# Patient Record
Sex: Female | Born: 1935 | Race: Black or African American | Hispanic: No | Smoking: Never smoker
Health system: Southern US, Community
[De-identification: ages and names within clinical notes are randomized; demographics above are authoritative.]

## PROBLEM LIST (undated history)

## (undated) DIAGNOSIS — R1013 Epigastric pain: Secondary | ICD-10-CM

## (undated) DIAGNOSIS — I10 Essential (primary) hypertension: Secondary | ICD-10-CM

## (undated) DIAGNOSIS — E785 Hyperlipidemia, unspecified: Secondary | ICD-10-CM

## (undated) DIAGNOSIS — R51 Headache: Secondary | ICD-10-CM

## (undated) DIAGNOSIS — K649 Unspecified hemorrhoids: Secondary | ICD-10-CM

## (undated) DIAGNOSIS — E119 Type 2 diabetes mellitus without complications: Secondary | ICD-10-CM

## (undated) DIAGNOSIS — N181 Chronic kidney disease, stage 1: Secondary | ICD-10-CM

## (undated) DIAGNOSIS — M1990 Unspecified osteoarthritis, unspecified site: Secondary | ICD-10-CM

## (undated) DIAGNOSIS — R55 Syncope and collapse: Secondary | ICD-10-CM

## (undated) DIAGNOSIS — M109 Gout, unspecified: Secondary | ICD-10-CM

## (undated) DIAGNOSIS — K3189 Other diseases of stomach and duodenum: Secondary | ICD-10-CM

## (undated) DIAGNOSIS — R519 Headache, unspecified: Secondary | ICD-10-CM

## (undated) DIAGNOSIS — T6701XA Heatstroke and sunstroke, initial encounter: Secondary | ICD-10-CM

## (undated) HISTORY — DX: Other diseases of stomach and duodenum: K31.89

## (undated) HISTORY — PX: COLON SURGERY: SHX602

## (undated) HISTORY — DX: Type 2 diabetes mellitus without complications: E11.9

## (undated) HISTORY — DX: Gout, unspecified: M10.9

## (undated) HISTORY — DX: Hyperlipidemia, unspecified: E78.5

## (undated) HISTORY — DX: Unspecified hemorrhoids: K64.9

## (undated) HISTORY — PX: TONSILLECTOMY: SHX5217

## (undated) HISTORY — DX: Chronic kidney disease, stage 1: N18.1

## (undated) HISTORY — DX: Essential (primary) hypertension: I10

## (undated) HISTORY — DX: Heatstroke and sunstroke, initial encounter: T67.01XA

## (undated) HISTORY — DX: Other diseases of stomach and duodenum: R10.13

---

## 1998-01-28 ENCOUNTER — Encounter: Payer: Self-pay | Admitting: Internal Medicine

## 1998-01-28 LAB — CONVERTED CEMR LAB

## 2000-03-01 ENCOUNTER — Encounter: Payer: Self-pay | Admitting: Internal Medicine

## 2000-03-01 ENCOUNTER — Encounter: Admission: RE | Admit: 2000-03-01 | Discharge: 2000-03-01 | Payer: Self-pay | Admitting: Internal Medicine

## 2001-02-28 ENCOUNTER — Encounter: Payer: Self-pay | Admitting: Internal Medicine

## 2001-02-28 ENCOUNTER — Encounter: Admission: RE | Admit: 2001-02-28 | Discharge: 2001-02-28 | Payer: Self-pay | Admitting: Internal Medicine

## 2002-04-03 ENCOUNTER — Encounter: Admission: RE | Admit: 2002-04-03 | Discharge: 2002-04-03 | Payer: Self-pay | Admitting: Internal Medicine

## 2002-04-03 ENCOUNTER — Encounter: Payer: Self-pay | Admitting: Internal Medicine

## 2002-11-17 ENCOUNTER — Encounter: Admission: RE | Admit: 2002-11-17 | Discharge: 2002-11-17 | Payer: Self-pay | Admitting: Internal Medicine

## 2002-11-17 ENCOUNTER — Encounter: Payer: Self-pay | Admitting: Internal Medicine

## 2003-01-20 ENCOUNTER — Encounter: Payer: Self-pay | Admitting: Emergency Medicine

## 2003-01-20 ENCOUNTER — Emergency Department (HOSPITAL_COMMUNITY): Admission: EM | Admit: 2003-01-20 | Discharge: 2003-01-20 | Payer: Self-pay | Admitting: Emergency Medicine

## 2003-04-13 ENCOUNTER — Encounter: Payer: Self-pay | Admitting: Internal Medicine

## 2003-04-13 ENCOUNTER — Encounter: Admission: RE | Admit: 2003-04-13 | Discharge: 2003-04-13 | Payer: Self-pay | Admitting: Internal Medicine

## 2003-05-10 ENCOUNTER — Encounter: Admission: RE | Admit: 2003-05-10 | Discharge: 2003-05-10 | Payer: Self-pay | Admitting: Occupational Medicine

## 2003-05-10 ENCOUNTER — Encounter: Payer: Self-pay | Admitting: Occupational Medicine

## 2003-10-01 ENCOUNTER — Ambulatory Visit (HOSPITAL_COMMUNITY): Admission: RE | Admit: 2003-10-01 | Discharge: 2003-10-01 | Payer: Self-pay | Admitting: Internal Medicine

## 2004-06-09 ENCOUNTER — Encounter: Admission: RE | Admit: 2004-06-09 | Discharge: 2004-06-09 | Payer: Self-pay | Admitting: Internal Medicine

## 2004-09-15 ENCOUNTER — Ambulatory Visit: Payer: Self-pay | Admitting: Internal Medicine

## 2004-10-24 ENCOUNTER — Ambulatory Visit: Payer: Self-pay | Admitting: Internal Medicine

## 2004-10-31 ENCOUNTER — Ambulatory Visit: Payer: Self-pay | Admitting: Internal Medicine

## 2005-01-12 ENCOUNTER — Ambulatory Visit: Payer: Self-pay | Admitting: Internal Medicine

## 2005-01-19 ENCOUNTER — Ambulatory Visit: Payer: Self-pay | Admitting: Internal Medicine

## 2005-02-01 ENCOUNTER — Ambulatory Visit: Payer: Self-pay | Admitting: Internal Medicine

## 2005-02-14 ENCOUNTER — Ambulatory Visit: Payer: Self-pay | Admitting: Internal Medicine

## 2005-03-22 ENCOUNTER — Ambulatory Visit: Payer: Self-pay | Admitting: Internal Medicine

## 2005-03-22 ENCOUNTER — Ambulatory Visit (HOSPITAL_COMMUNITY): Admission: RE | Admit: 2005-03-22 | Discharge: 2005-03-22 | Payer: Self-pay | Admitting: Internal Medicine

## 2005-03-28 ENCOUNTER — Emergency Department (HOSPITAL_COMMUNITY): Admission: EM | Admit: 2005-03-28 | Discharge: 2005-03-29 | Payer: Self-pay | Admitting: Emergency Medicine

## 2005-03-31 ENCOUNTER — Emergency Department (HOSPITAL_COMMUNITY): Admission: EM | Admit: 2005-03-31 | Discharge: 2005-04-01 | Payer: Self-pay | Admitting: Emergency Medicine

## 2005-04-05 ENCOUNTER — Ambulatory Visit: Payer: Self-pay | Admitting: Internal Medicine

## 2005-04-13 ENCOUNTER — Ambulatory Visit: Payer: Self-pay | Admitting: Internal Medicine

## 2005-04-20 ENCOUNTER — Ambulatory Visit: Payer: Self-pay | Admitting: Internal Medicine

## 2005-04-25 ENCOUNTER — Emergency Department (HOSPITAL_COMMUNITY): Admission: EM | Admit: 2005-04-25 | Discharge: 2005-04-25 | Payer: Self-pay | Admitting: Emergency Medicine

## 2005-05-04 ENCOUNTER — Ambulatory Visit: Payer: Self-pay | Admitting: Internal Medicine

## 2005-05-04 ENCOUNTER — Ambulatory Visit: Payer: Self-pay | Admitting: Gastroenterology

## 2005-05-10 ENCOUNTER — Ambulatory Visit: Payer: Self-pay | Admitting: Internal Medicine

## 2005-05-18 ENCOUNTER — Ambulatory Visit: Payer: Self-pay | Admitting: Gastroenterology

## 2005-07-27 ENCOUNTER — Encounter: Admission: RE | Admit: 2005-07-27 | Discharge: 2005-07-27 | Payer: Self-pay | Admitting: Internal Medicine

## 2005-11-23 ENCOUNTER — Ambulatory Visit: Payer: Self-pay | Admitting: Internal Medicine

## 2006-02-25 ENCOUNTER — Encounter: Admission: RE | Admit: 2006-02-25 | Discharge: 2006-02-25 | Payer: Self-pay | Admitting: Occupational Medicine

## 2006-05-09 IMAGING — CR DG RIBS 2V*R*
4 series · 4 of 4 positions shown · non-contrast
Comparison: none

CLINICAL DATA: 79-year-old female, status post fall with pain in right ribs. 
 RIGHT RIBS - 4 VIEW:

[view not recorded (1 of 4)]
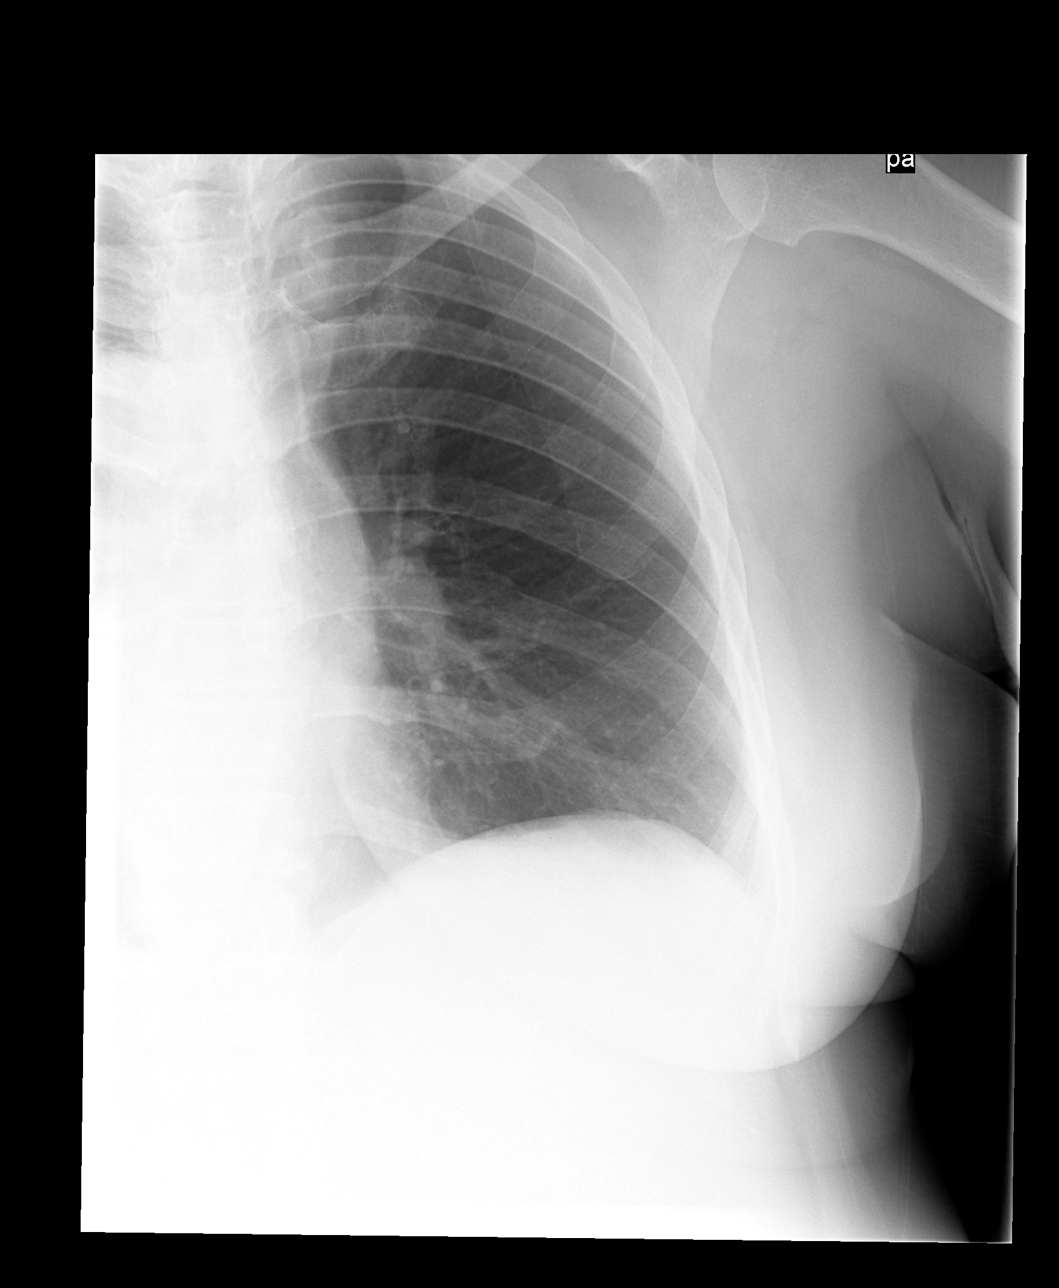

[view not recorded (2 of 4)]
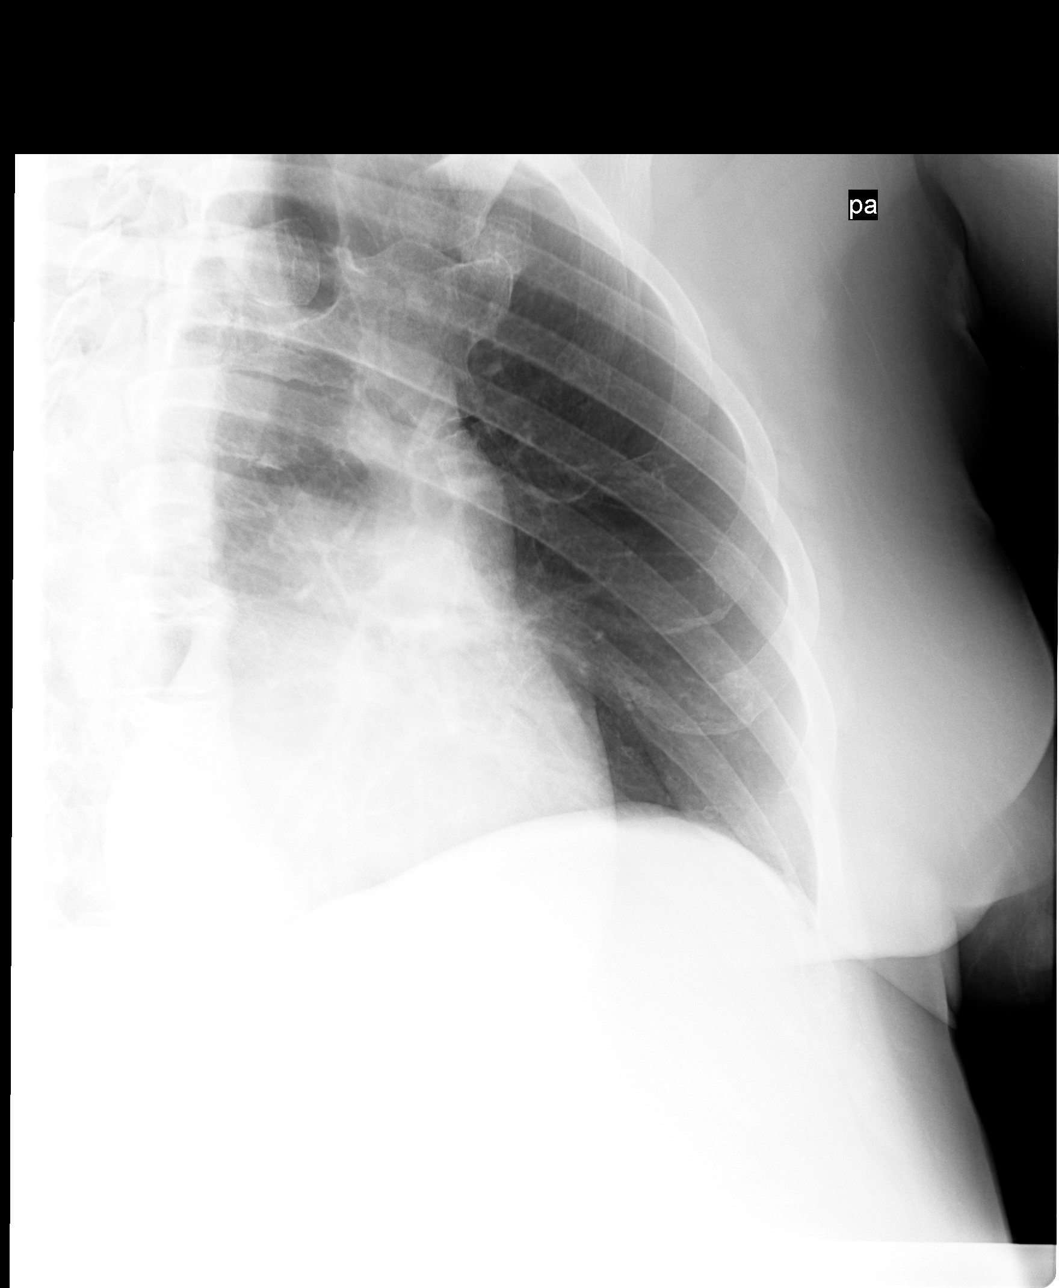

[view not recorded (3 of 4)]
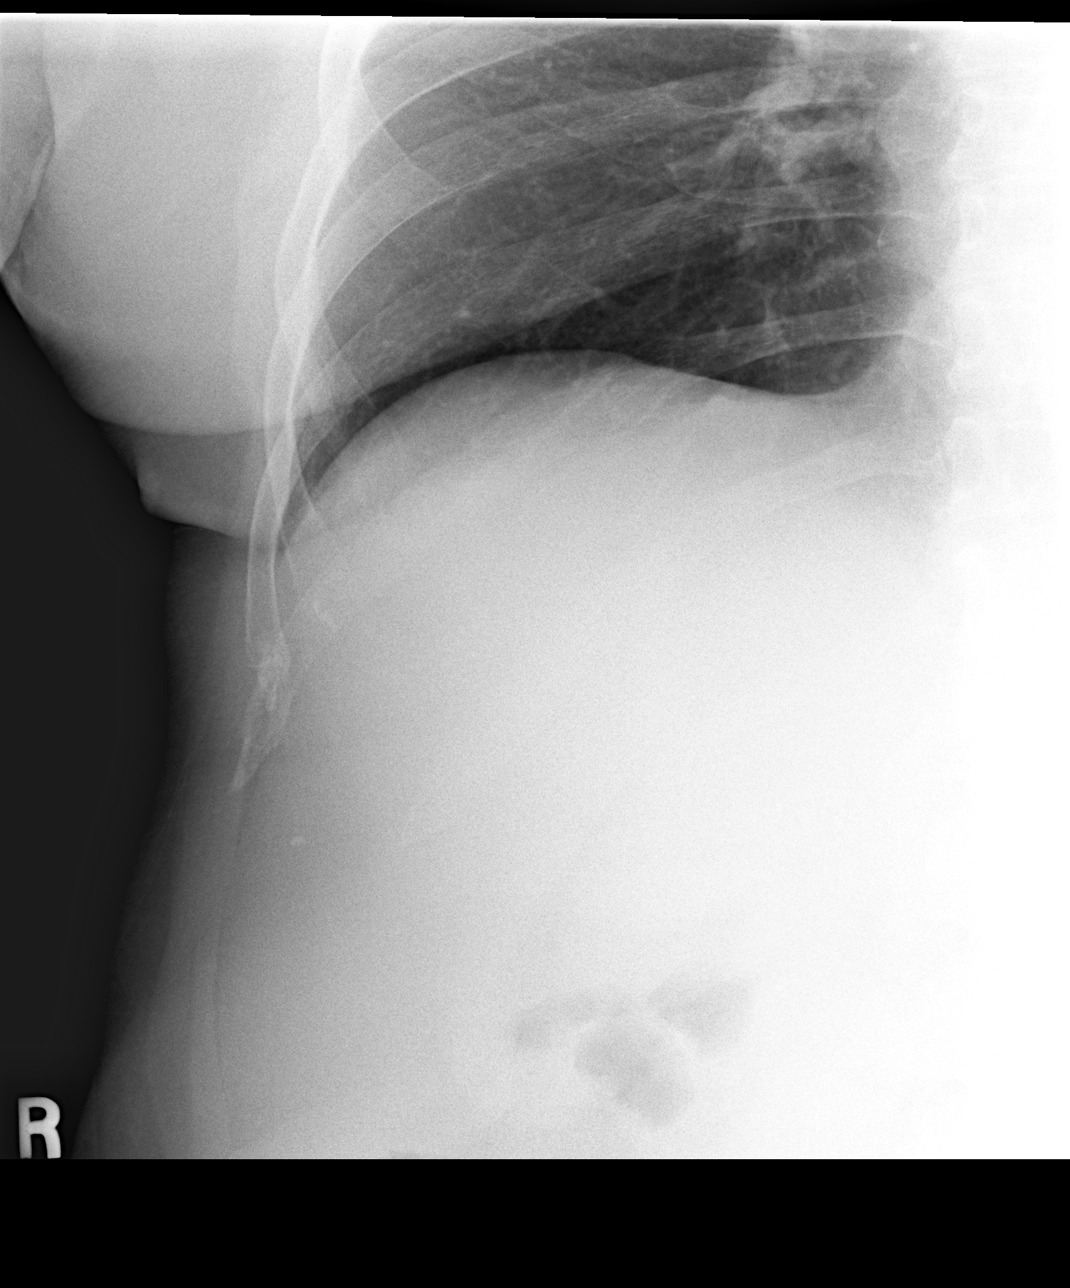

[view not recorded (4 of 4)]
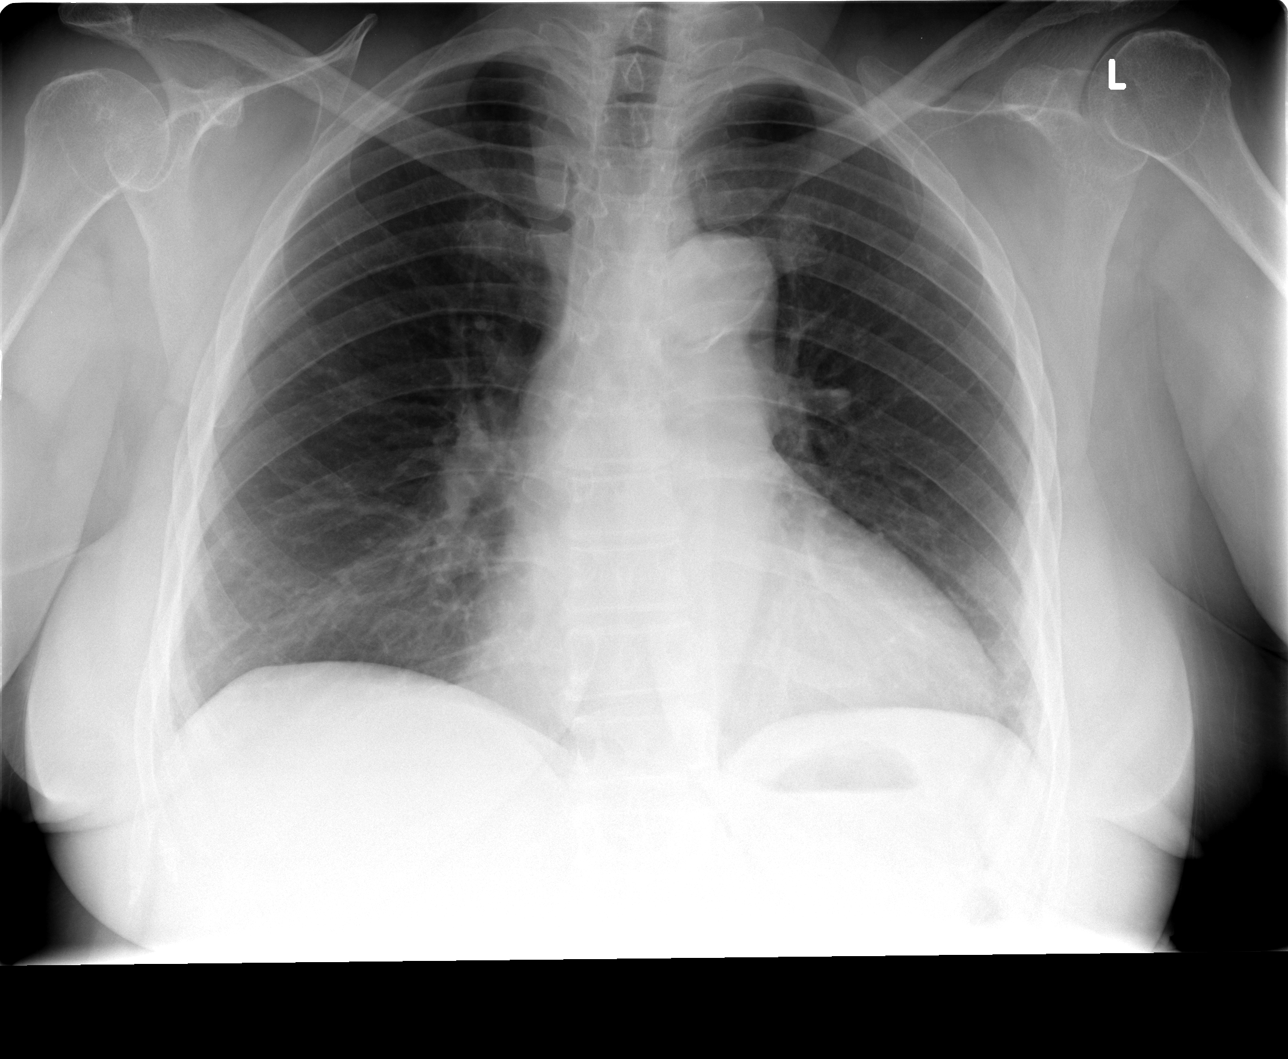

[4 of 4 positions shown; findings below may reference images not displayed]

FINDINGS: A single AP view of the chest demonstrates cardiopericardial silhouette is mildly enlarged.   Atherosclerotic calcifications are noted at the aortic arch.   The lungs are clear.   No focal rib fracture is identified.
IMPRESSION: 1.  Cardiomegaly without failure.
 2.  No focal abnormality of the ribs.

## 2006-06-21 ENCOUNTER — Ambulatory Visit: Payer: Self-pay | Admitting: Internal Medicine

## 2006-07-30 ENCOUNTER — Ambulatory Visit: Payer: Self-pay | Admitting: Internal Medicine

## 2006-09-20 ENCOUNTER — Encounter: Admission: RE | Admit: 2006-09-20 | Discharge: 2006-09-20 | Payer: Self-pay | Admitting: Occupational Medicine

## 2006-10-25 ENCOUNTER — Encounter: Admission: RE | Admit: 2006-10-25 | Discharge: 2006-10-25 | Payer: Self-pay | Admitting: Internal Medicine

## 2006-11-18 ENCOUNTER — Ambulatory Visit: Payer: Self-pay | Admitting: Internal Medicine

## 2006-12-20 ENCOUNTER — Ambulatory Visit: Payer: Self-pay | Admitting: Internal Medicine

## 2007-07-22 ENCOUNTER — Encounter: Payer: Self-pay | Admitting: Internal Medicine

## 2007-07-22 DIAGNOSIS — E7800 Pure hypercholesterolemia, unspecified: Secondary | ICD-10-CM

## 2007-07-22 DIAGNOSIS — M109 Gout, unspecified: Secondary | ICD-10-CM

## 2007-07-22 DIAGNOSIS — I10 Essential (primary) hypertension: Secondary | ICD-10-CM

## 2007-07-22 DIAGNOSIS — E1169 Type 2 diabetes mellitus with other specified complication: Secondary | ICD-10-CM

## 2007-09-23 ENCOUNTER — Encounter: Payer: Self-pay | Admitting: Internal Medicine

## 2007-09-23 ENCOUNTER — Telehealth: Payer: Self-pay | Admitting: Internal Medicine

## 2007-10-29 ENCOUNTER — Encounter: Payer: Self-pay | Admitting: Internal Medicine

## 2007-11-12 ENCOUNTER — Telehealth: Payer: Self-pay | Admitting: Internal Medicine

## 2007-11-21 ENCOUNTER — Encounter: Admission: RE | Admit: 2007-11-21 | Discharge: 2007-11-21 | Payer: Self-pay | Admitting: Internal Medicine

## 2008-01-09 ENCOUNTER — Ambulatory Visit: Payer: Self-pay | Admitting: Internal Medicine

## 2008-01-09 LAB — CONVERTED CEMR LAB
ALT: 29 units/L (ref 0–35)
AST: 28 units/L (ref 0–37)
BUN: 28 mg/dL — ABNORMAL HIGH (ref 6–23)
Bilirubin, Direct: 0.1 mg/dL (ref 0.0–0.3)
CO2: 30 meq/L (ref 19–32)
Calcium: 9.9 mg/dL (ref 8.4–10.5)
Chloride: 105 meq/L (ref 96–112)
Glucose, Bld: 113 mg/dL — ABNORMAL HIGH (ref 70–99)
Hgb A1c MFr Bld: 7.6 % — ABNORMAL HIGH (ref 4.6–6.0)
Sodium: 141 meq/L (ref 135–145)
Total Bilirubin: 0.6 mg/dL (ref 0.3–1.2)
Total Protein: 7.7 g/dL (ref 6.0–8.3)
Triglycerides: 227 mg/dL (ref 0–149)

## 2008-01-11 ENCOUNTER — Encounter: Payer: Self-pay | Admitting: Internal Medicine

## 2008-02-27 ENCOUNTER — Encounter: Payer: Self-pay | Admitting: Internal Medicine

## 2008-03-09 ENCOUNTER — Telehealth: Payer: Self-pay | Admitting: Internal Medicine

## 2008-04-01 ENCOUNTER — Encounter: Payer: Self-pay | Admitting: Internal Medicine

## 2008-06-04 ENCOUNTER — Telehealth: Payer: Self-pay | Admitting: Internal Medicine

## 2008-06-07 ENCOUNTER — Ambulatory Visit: Payer: Self-pay | Admitting: Internal Medicine

## 2008-06-07 DIAGNOSIS — T6701XA Heatstroke and sunstroke, initial encounter: Secondary | ICD-10-CM

## 2008-06-22 ENCOUNTER — Telehealth: Payer: Self-pay | Admitting: Internal Medicine

## 2008-07-13 ENCOUNTER — Telehealth: Payer: Self-pay | Admitting: Internal Medicine

## 2008-07-14 ENCOUNTER — Ambulatory Visit: Payer: Self-pay | Admitting: Internal Medicine

## 2008-09-27 ENCOUNTER — Telehealth: Payer: Self-pay | Admitting: Internal Medicine

## 2008-10-05 ENCOUNTER — Telehealth: Payer: Self-pay | Admitting: Internal Medicine

## 2009-01-17 ENCOUNTER — Telehealth: Payer: Self-pay | Admitting: Internal Medicine

## 2009-01-18 ENCOUNTER — Ambulatory Visit: Payer: Self-pay | Admitting: Internal Medicine

## 2009-01-18 LAB — CONVERTED CEMR LAB
Alkaline Phosphatase: 92 units/L (ref 39–117)
Bilirubin, Direct: 0.1 mg/dL (ref 0.0–0.3)
CO2: 30 meq/L (ref 19–32)
Calcium: 9.4 mg/dL (ref 8.4–10.5)
Chloride: 104 meq/L (ref 96–112)
Cholesterol: 117 mg/dL (ref 0–200)
GFR calc Af Amer: 51 mL/min
HDL: 39.7 mg/dL (ref >39.0)
Total Bilirubin: 0.8 mg/dL (ref 0.3–1.2)
Total CHOL/HDL Ratio: 2.9
Triglycerides: 147 mg/dL (ref 0–149)

## 2009-01-22 ENCOUNTER — Encounter: Payer: Self-pay | Admitting: Internal Medicine

## 2009-05-17 ENCOUNTER — Telehealth: Payer: Self-pay | Admitting: Internal Medicine

## 2009-05-23 ENCOUNTER — Telehealth: Payer: Self-pay | Admitting: Internal Medicine

## 2009-06-15 ENCOUNTER — Encounter: Payer: Self-pay | Admitting: Internal Medicine

## 2009-06-22 ENCOUNTER — Encounter: Payer: Self-pay | Admitting: Internal Medicine

## 2009-07-21 ENCOUNTER — Telehealth: Payer: Self-pay | Admitting: Internal Medicine

## 2009-09-19 ENCOUNTER — Telehealth: Payer: Self-pay | Admitting: Internal Medicine

## 2009-09-30 ENCOUNTER — Telehealth: Payer: Self-pay | Admitting: Internal Medicine

## 2009-10-21 ENCOUNTER — Ambulatory Visit: Payer: Self-pay | Admitting: Internal Medicine

## 2009-10-21 DIAGNOSIS — N181 Chronic kidney disease, stage 1: Secondary | ICD-10-CM

## 2009-10-21 LAB — CONVERTED CEMR LAB
BUN: 41 mg/dL — ABNORMAL HIGH (ref 6–23)
Bilirubin, Direct: 0.1 mg/dL (ref 0.0–0.3)
Calcium: 9.3 mg/dL (ref 8.4–10.5)
Direct LDL: 41.7 mg/dL
GFR calc non Af Amer: 39.77 mL/min (ref >60)
Glucose, Bld: 122 mg/dL — ABNORMAL HIGH (ref 70–99)
HDL: 36.4 mg/dL — ABNORMAL LOW (ref >39.00)
Total Bilirubin: 0.5 mg/dL (ref 0.3–1.2)
Total Protein: 7.8 g/dL (ref 6.0–8.3)
Triglycerides: 233 mg/dL — ABNORMAL HIGH (ref 0.0–149.0)
Uric Acid, Serum: 8.6 mg/dL — ABNORMAL HIGH (ref 2.4–7.0)
VLDL: 46.6 mg/dL — ABNORMAL HIGH (ref 0.0–40.0)

## 2009-12-05 ENCOUNTER — Telehealth: Payer: Self-pay | Admitting: Internal Medicine

## 2010-01-09 ENCOUNTER — Telehealth: Payer: Self-pay | Admitting: Internal Medicine

## 2010-01-17 ENCOUNTER — Encounter: Payer: Self-pay | Admitting: Internal Medicine

## 2010-02-13 ENCOUNTER — Telehealth: Payer: Self-pay | Admitting: Internal Medicine

## 2010-02-23 ENCOUNTER — Telehealth: Payer: Self-pay | Admitting: Internal Medicine

## 2010-03-02 ENCOUNTER — Ambulatory Visit: Payer: Self-pay | Admitting: Internal Medicine

## 2010-03-02 DIAGNOSIS — R1013 Epigastric pain: Secondary | ICD-10-CM

## 2010-03-02 DIAGNOSIS — K3189 Other diseases of stomach and duodenum: Secondary | ICD-10-CM

## 2010-03-20 ENCOUNTER — Encounter: Payer: Self-pay | Admitting: Internal Medicine

## 2010-03-22 ENCOUNTER — Telehealth: Payer: Self-pay | Admitting: Internal Medicine

## 2010-04-24 ENCOUNTER — Encounter: Payer: Self-pay | Admitting: Internal Medicine

## 2010-05-08 ENCOUNTER — Encounter: Payer: Self-pay | Admitting: Internal Medicine

## 2010-05-17 ENCOUNTER — Telehealth: Payer: Self-pay | Admitting: Internal Medicine

## 2010-05-18 ENCOUNTER — Telehealth: Payer: Self-pay | Admitting: Internal Medicine

## 2010-05-25 ENCOUNTER — Telehealth: Payer: Self-pay | Admitting: Internal Medicine

## 2010-05-29 ENCOUNTER — Ambulatory Visit: Payer: Self-pay | Admitting: Internal Medicine

## 2010-05-29 DIAGNOSIS — K648 Other hemorrhoids: Secondary | ICD-10-CM | POA: Insufficient documentation

## 2010-05-31 ENCOUNTER — Telehealth (INDEPENDENT_AMBULATORY_CARE_PROVIDER_SITE_OTHER): Payer: Self-pay | Admitting: *Deleted

## 2010-06-19 ENCOUNTER — Telehealth: Payer: Self-pay | Admitting: Internal Medicine

## 2010-07-04 ENCOUNTER — Telehealth: Payer: Self-pay | Admitting: Internal Medicine

## 2010-07-05 ENCOUNTER — Ambulatory Visit: Payer: Self-pay | Admitting: Internal Medicine

## 2010-07-05 DIAGNOSIS — K3184 Gastroparesis: Secondary | ICD-10-CM | POA: Insufficient documentation

## 2010-07-12 ENCOUNTER — Telehealth: Payer: Self-pay | Admitting: Internal Medicine

## 2010-07-18 ENCOUNTER — Telehealth: Payer: Self-pay | Admitting: Internal Medicine

## 2010-08-08 ENCOUNTER — Ambulatory Visit (HOSPITAL_COMMUNITY): Admission: RE | Admit: 2010-08-08 | Discharge: 2010-08-08 | Payer: Self-pay | Admitting: Internal Medicine

## 2010-08-22 ENCOUNTER — Ambulatory Visit: Payer: Self-pay | Admitting: Internal Medicine

## 2010-10-27 ENCOUNTER — Telehealth: Payer: Self-pay | Admitting: Internal Medicine

## 2010-11-20 ENCOUNTER — Telehealth: Payer: Self-pay | Admitting: Internal Medicine

## 2010-12-03 ENCOUNTER — Encounter: Payer: Self-pay | Admitting: Internal Medicine

## 2010-12-12 NOTE — Letter (Signed)
Summary: [facility]   Imported By: Lennie Odor 06/01/2010 10:11:53  _____________________________________________________________________  External Attachment:    Type:   Image     Comment:   External Document

## 2010-12-12 NOTE — Progress Notes (Signed)
       Past History:  Care Management: Ophthalmology:Pt had dilitated  eye exam with Dr Pearlean Brownie 04/24/2010 no cataract or diabetic retinopathy found. Advised f/u in 1 year

## 2010-12-12 NOTE — Progress Notes (Signed)
  Phone Note Refill Request Message from:  Fax from Pharmacy on July 04, 2010 10:36 AM  Refills Requested: Medication #1:  DICLOFENAC SODIUM 50 MG  TBEC two times a day Please Advise refills.   Initial call taken by: Ami Bullins CMA,  July 04, 2010 10:37 AM  Follow-up for Phone Call        ok for refill prn Follow-up by: Jacques Navy MD,  July 04, 2010 1:15 PM    Prescriptions: DICLOFENAC SODIUM 50 MG  TBEC (DICLOFENAC SODIUM) two times a day  #180 Tablet x 0   Entered by:   Ami Bullins CMA   Authorized by:   Jacques Navy MD   Signed by:   Bill Salinas CMA on 07/05/2010   Method used:   [facility]  [address] [phone][facility] (retail)       [address]       [place], [place]  [address]       Ph: [phone] or [phone]       Fax: [phone]   RxID:   [id number]

## 2010-12-12 NOTE — Medication Information (Signed)
Summary: [facility]   Imported By: Sherian Rein 05/10/2010 14:40:19  _____________________________________________________________________  External Attachment:    Type:   Image     Comment:   External Document

## 2010-12-12 NOTE — Progress Notes (Signed)
Summary: Gas  Phone Note Call from Patient Call back at Home Phone [phone]   Summary of Call: Mylanta advised at last office visit for gas has not helped. She c/o "everything she eats" causing gas. Please advise.  Initial call taken by: Lamar Sprinkles, CMA,  June 19, 2010 9:37 AM  Follow-up for Phone Call        continue Beano. Take "Gas-X" as well. Ifsym;ptoms persist will increase priolosec (omeprazole) to 40mg  daily, If symptoms persist after that GI consult Follow-up by: Jacques Navy MD,  June 19, 2010 9:40 AM  Additional Follow-up for Phone Call Additional follow up Details #1::        Patient notified.Marland KitchenMarland KitchenMarland KitchenAlvy Beal Archie CMA  June 19, 2010 4:51 PM     New/Updated Medications: OMEPRAZOLE 20 MG CPDR (OMEPRAZOLE) 2 q am Prescriptions: OMEPRAZOLE 20 MG CPDR (OMEPRAZOLE) 2 q am  #60 x 6   Entered by:   Rock Nephew CMA   Authorized by:   Jacques Navy MD   Signed by:   Rock Nephew CMA on 06/19/2010   Method used:   [facility]        [facility]  [address] [phone]* (retail)       [address]       [place], [place]  [id number]       Ph: [phone] or [phone]       Fax: [phone]   RxID:   [id number]

## 2010-12-12 NOTE — Progress Notes (Signed)
Summary: RF  Phone Note Refill Request   Refills Requested: Medication #1:  HEMORRHOIDAL-HC 25 MG  SUPP INSERT SUPPOSITORY two times a day as needed Pt's last ov was 07/05/2010, please Advise refill  Initial call taken by: Ami Bullins CMA,  July 18, 2010 4:20 PM  Follow-up for Phone Call        ok to refill. If hemorrhoids not resolved after second round of suppositories will need to be seen.  Follow-up by: Jacques Navy MD,  July 18, 2010 5:38 PM  Additional Follow-up for Phone Call Additional follow up Details #1::        informed pt  Additional Follow-up by: Ami Bullins CMA,  July 19, 2010 9:13 AM    Prescriptions: HEMORRHOIDAL-HC 25 MG  SUPP (HYDROCORTISONE ACETATE) INSERT SUPPOSITORY two times a day as needed  #12 Supposito x 0   Entered by:   Lamar Sprinkles, CMA   Authorized by:   Jacques Navy MD   Signed by:   Lamar Sprinkles, CMA on 07/18/2010   Method used:   [facility]        [facility]  [address] [phone]* (retail)       [address]       [place], [place]  [address]       Ph: [phone] or [phone]       Fax: [phone]   RxID:   [id number]

## 2010-12-12 NOTE — Assessment & Plan Note (Signed)
Summary: ABD PROBLEMS/ NAUSEA/NWS   Vital Signs:  Patient profile:   77 year old female Height:      64 inches Weight:      183 pounds BMI:     31.53 O2 Sat:      98 % on Room air Temp:     98.1 degrees F oral Pulse rate:   72 / minute BP sitting:   138 / 70  (left arm) Cuff size:   regular  Vitals Entered By: Bill Salinas CMA (July 05, 2010 11:03 AM)  O2 Flow:  Room air CC: pt here with c/o nausea/ ab   Primary Care Provider:  Norins  CC:  pt here with c/o nausea/ ab.  History of Present Illness: Patient presents for persistent post-prandial nausea, without emesis. No pain in upper abdomen. she has some increased eructation. No swallow difficulty. She reports regular bowel movements. She does have minor bloating post-prandial as well as flatus, better with Beano. She denies early satiety. She denies hematochezia or melena. She does take MOM in addition to Beano.   Current Medications (verified): 1)  Norvasc 10 Mg  Tabs (Amlodipine Besylate) .... Once Daily 2)  Cozaar 100 Mg  Tabs (Losartan Potassium) .... Once Daily 3)  Bumex 2 Mg  Tabs (Bumetanide) .... Once Daily 4)  Amaryl 4 Mg  Tabs (Glimepiride) .... Am and Pm For Control of Diabetes 5)  Clonidine Hcl 0.1 Mg Tabs (Clonidine Hcl) .Marland Kitchen.. 1 By Mouth Two Times A Day For Blood Pressure 6)  Daily Multiple Vitamins   Tabs (Multiple Vitamin) .... Once Daily 7)  Bl Vitamin C 500 Mg  Tabs (Ascorbic Acid) .... Once Daily 8)  Diclofenac Sodium 50 Mg  Tbec (Diclofenac Sodium) .... Two Times A Day 9)  Simvastatin 40 Mg  Tabs (Simvastatin) .... 1/2 Once Daily 10)  Meclizine Hcl 25 Mg  Tabs (Meclizine Hcl) .... As Needed 11)  Alprazolam 0.25 Mg  Tabs (Alprazolam) .... 1/4  By Mouth Once Daily As Needed 12)  Hemorrhoidal-Hc 25 Mg  Supp (Hydrocortisone Acetate) .... Insert Suppository Two Times A Day As Needed 13)  Promethazine Hcl 12.5 Mg Tabs (Promethazine Hcl) .Marland Kitchen.. 1 Q 6 As Needed Nausea 14)  Promethazine-Codeine 6.25-10 Mg/46ml Syrp  (Promethazine-Codeine) .Marland Kitchen.. 1 Tsp Q 6 As Needed Cough 15)  Allopurinol 100 Mg Tabs (Allopurinol) .Marland Kitchen.. 1 By Mouth Once Daily For Prevention of Gout. 16)  Omeprazole 20 Mg Cpdr (Omeprazole) .... 2 Q Am  Allergies (verified): 1)  ! Cipro 2)  ! Cardizem  Past History:  Past Medical History: Last updated: 05/29/2010 DYSPEPSIA (ICD-536.8) CHRONIC KIDNEY DISEASE STAGE I (ICD-585.1) HEAT STROKE (ICD-992.0) * COLON SURGERY HYPERLIPIDEMIA (ICD-272.4) GOUT (ICD-274.9) DIABETES MELLITUS, TYPE II (ICD-250.00) HYPERTENSION (ICD-401.9) Hemorrhoids  Past Surgical History: Last updated: 07/22/2007 Tonsillectomy * COLON SURGERY    PSH reviewed for relevance, FH reviewed for relevance  Review of Systems       The patient complains of abdominal pain.  The patient denies anorexia, fever, weight loss, weight gain, chest pain, dyspnea on exertion, prolonged cough, hemoptysis, melena, hematochezia, incontinence, suspicious skin lesions, difficulty walking, depression, unusual weight change, and enlarged lymph nodes.    Physical Exam  General:  overweight AA female in no distress Head:  normocephalic and atraumatic.   Eyes:  C&S clear Neck:  supple.   Chest Wall:  no deformities and no tenderness.   Lungs:  normal respiratory effort and normal breath sounds.   Heart:  normal rate, regular rhythm, no murmur,  no gallop, and no JVD.   Abdomen:  soft, non-tender, no distention, no guarding, no rigidity, and no hepatomegaly.   Msk:  normal ROM, no joint tenderness, no joint swelling, and no joint warmth.   Pulses:  2+ radial Neurologic:  alert & oriented X3, cranial nerves II-XII intact, strength normal in all extremities, sensation intact to light touch, and gait normal.   Skin:  turgor normal, color normal, no suspicious lesions, no petechiae, and no ulcerations.   Psych:  Oriented X3, memory intact for recent and remote, normally interactive, and good eye contact.     Impression &  Recommendations:  Problem # 1:  NAUSEA, CHRONIC (ICD-787.02)  Suspect gastroparesis as cause of nausea. May also be some irritation from diclofenac although she has no tenderness to palp.   Plan - hold diclofenac          gastric emptying study.  Her updated medication list for this problem includes:    Meclizine Hcl 25 Mg Tabs (Meclizine hcl) .Marland Kitchen... As needed  Orders: Radiology Referral (Radiology)  Complete Medication List: 1)  Norvasc 10 Mg Tabs (Amlodipine besylate) .... Once daily 2)  Cozaar 100 Mg Tabs (Losartan potassium) .... Once daily 3)  Bumex 2 Mg Tabs (Bumetanide) .... Once daily 4)  Amaryl 4 Mg Tabs (Glimepiride) .... Am and pm for control of diabetes 5)  Clonidine Hcl 0.1 Mg Tabs (Clonidine hcl) .Marland Kitchen.. 1 by mouth two times a day for blood pressure 6)  Daily Multiple Vitamins Tabs (Multiple vitamin) .... Once daily 7)  Bl Vitamin C 500 Mg Tabs (Ascorbic acid) .... Once daily 8)  Diclofenac Sodium 50 Mg Tbec (Diclofenac sodium) .... Two times a day 9)  Simvastatin 40 Mg Tabs (Simvastatin) .... 1/2 once daily 10)  Meclizine Hcl 25 Mg Tabs (Meclizine hcl) .... As needed 11)  Alprazolam 0.25 Mg Tabs (Alprazolam) .... 1/4  by mouth once daily as needed 12)  Hemorrhoidal-hc 25 Mg Supp (Hydrocortisone acetate) .... Insert suppository two times a day as needed 13)  Promethazine Hcl 12.5 Mg Tabs (Promethazine hcl) .Marland Kitchen.. 1 q 6 as needed nausea 14)  Promethazine-codeine 6.25-10 Mg/39ml Syrp (Promethazine-codeine) .Marland Kitchen.. 1 tsp q 6 as needed cough 15)  Allopurinol 100 Mg Tabs (Allopurinol) .Marland Kitchen.. 1 by mouth once daily for prevention of gout. 16)  Omeprazole 20 Mg Cpdr (Omeprazole) .... 2 q am  Patient Instructions: 1)  Nausea - suspect there may be a problem with how the stomach is working (gastroparesis) that may be the cause of nausea. It may also be a problem from the diclofenac. Plan - hold the diclofenac for 1 week. Will have staff schedule a gastric emptying study at radiology  department to measure stomach activity.

## 2010-12-12 NOTE — Assessment & Plan Note (Signed)
Summary: DISCUSS GASTRIC EMPTYING STUDY PER FLAG/NWS  #   Vital Signs:  Patient profile:   77 year old female Height:      64 inches (162.56 cm) Weight:      182.75 pounds (83.07 kg) BMI:     31.48 O2 Sat:      98 % on Room air Temp:     97.7 degrees F (36.50 degrees C) oral Pulse rate:   67 / minute BP sitting:   136 / 72  (left arm) Cuff size:   regular  Vitals Entered By: Brenton Grills MA (August 22, 2010 9:49 AM)  O2 Flow:  Room air CC: discuss gastric emptying study/aj Is Patient Diabetic? Yes   Primary Care Elleen Coulibaly:  Norins  CC:  discuss gastric emptying study/aj.  History of Present Illness: Patient had recent gastric emptying study - 91% retention at 2 hrs, normal 30% or less. She denies pain, severe bloating or pain with eating.   Current Medications (verified): 1)  Norvasc 10 Mg  Tabs (Amlodipine Besylate) .... Once Daily 2)  Cozaar 100 Mg  Tabs (Losartan Potassium) .... Once Daily 3)  Bumex 2 Mg  Tabs (Bumetanide) .... Once Daily 4)  Amaryl 4 Mg  Tabs (Glimepiride) .... Am and Pm For Control of Diabetes 5)  Clonidine Hcl 0.1 Mg Tabs (Clonidine Hcl) .Marland Kitchen.. 1 By Mouth Two Times A Day For Blood Pressure 6)  Daily Multiple Vitamins   Tabs (Multiple Vitamin) .... Once Daily 7)  Bl Vitamin C 500 Mg  Tabs (Ascorbic Acid) .... Once Daily 8)  Diclofenac Sodium 50 Mg  Tbec (Diclofenac Sodium) .... Two Times A Day 9)  Simvastatin 40 Mg  Tabs (Simvastatin) .... 1/2 Once Daily 10)  Meclizine Hcl 25 Mg  Tabs (Meclizine Hcl) .... As Needed 11)  Alprazolam 0.25 Mg  Tabs (Alprazolam) .... 1/4  By Mouth Once Daily As Needed 12)  Hemorrhoidal-Hc 25 Mg  Supp (Hydrocortisone Acetate) .... Insert Suppository Two Times A Day As Needed 13)  Promethazine Hcl 12.5 Mg Tabs (Promethazine Hcl) .Marland Kitchen.. 1 Q 6 As Needed Nausea 14)  Promethazine-Codeine 6.25-10 Mg/40ml Syrp (Promethazine-Codeine) .Marland Kitchen.. 1 Tsp Q 6 As Needed Cough 15)  Allopurinol 100 Mg Tabs (Allopurinol) .Marland Kitchen.. 1 By Mouth Once Daily  For Prevention of Gout. 16)  Omeprazole 20 Mg Cpdr (Omeprazole) .... 2 Q Am  Allergies (verified): 1)  ! Cipro 2)  ! Cardizem PMH-FH-SH reviewed-no changes except otherwise noted  Review of Systems  The patient denies anorexia, weight loss, abdominal pain, severe indigestion/heartburn, genital sores, difficulty walking, abnormal bleeding, and angioedema.    Physical Exam  General:  elderly AA woman in no distress Head:  Normocephalic and atraumatic without obvious abnormalities. No apparent alopecia or balding. Lungs:  normal respiratory effort and normal breath sounds.   Heart:  normal rate, regular rhythm, no rub, and no JVD.   Abdomen:  soft, non-tender, no guarding, no rigidity, and no abdominal hernia, hypoactive bowel sounds.   Neurologic:  alert & oriented X3 and gait normal.   Skin:  turgor normal and color normal.   Psych:  normally interactive, good eye contact, not anxious appearing, and not depressed appearing.     Impression & Recommendations:  Problem # 1:  GASTROPARESIS (ICD-536.3) probable cause of nausea is gastroparesis related to DM. she is not uncomfortable and is able to eat without N/V  Plan - no additional medications at this time.   Complete Medication List: 1)  Norvasc 10 Mg Tabs (Amlodipine  besylate) .... Once daily 2)  Cozaar 100 Mg Tabs (Losartan potassium) .... Once daily 3)  Bumex 2 Mg Tabs (Bumetanide) .... Once daily 4)  Amaryl 4 Mg Tabs (Glimepiride) .... Am and pm for control of diabetes 5)  Clonidine Hcl 0.1 Mg Tabs (Clonidine hcl) .Marland Kitchen.. 1 by mouth two times a day for blood pressure 6)  Daily Multiple Vitamins Tabs (Multiple vitamin) .... Once daily 7)  Bl Vitamin C 500 Mg Tabs (Ascorbic acid) .... Once daily 8)  Simvastatin 40 Mg Tabs (Simvastatin) .... 1/2 once daily 9)  Meclizine Hcl 25 Mg Tabs (Meclizine hcl) .... As needed 10)  Alprazolam 0.25 Mg Tabs (Alprazolam) .... 1/4  by mouth once daily as needed 11)  Hemorrhoidal-hc 25 Mg Supp  (Hydrocortisone acetate) .... Insert suppository two times a day as needed 12)  Promethazine Hcl 12.5 Mg Tabs (Promethazine hcl) .Marland Kitchen.. 1 q 6 as needed nausea 13)  Promethazine-codeine 6.25-10 Mg/46ml Syrp (Promethazine-codeine) .Marland Kitchen.. 1 tsp q 6 as needed cough 14)  Allopurinol 100 Mg Tabs (Allopurinol) .Marland Kitchen.. 1 by mouth once daily for prevention of gout. 15)  Omeprazole 20 Mg Cpdr (Omeprazole) .... 2 q am

## 2010-12-12 NOTE — Letter (Signed)
Summary: [facility]   Imported By: Lester Alvord 01/30/2010 09:41:20  _____________________________________________________________________  External Attachment:    Type:   Image     Comment:   External Document

## 2010-12-12 NOTE — Progress Notes (Signed)
  Phone Note Refill Request Message from:  Fax from Pharmacy on December 05, 2009 12:00 PM  Refills Requested: Medication #1:  DICLOFENAC SODIUM 50 MG  TBEC two times a day   Last Refilled: 09/06/2009 please Advise refill. [facility] [address]  Initial call taken by: Ami Bullins CMA,  December 05, 2009 12:00 PM  Follow-up for Phone Call        refill as needed  Follow-up by: Jacques Navy MD,  December 05, 2009 1:07 PM    Prescriptions: DICLOFENAC SODIUM 50 MG  TBEC (DICLOFENAC SODIUM) two times a day  #180 x 0   Entered by:   Ami Bullins CMA   Authorized by:   Jacques Navy MD   Signed by:   Bill Salinas CMA on 12/05/2009   Method used:   [facility]        [facility]  [address] [phone]* (retail)       [address]       [place], [place]  [id number]       Ph: [phone] or [phone]       Fax: [phone]   RxID:   [id number]

## 2010-12-12 NOTE — Letter (Signed)
Summary: Assunta Curtis MD  Assunta Curtis MD   Imported By: Lester [place] 03/29/2010 10:55:00  _____________________________________________________________________  External Attachment:    Type:   Image     Comment:   External Document

## 2010-12-12 NOTE — Progress Notes (Signed)
  Phone Note Refill Request Message from:  Fax from Pharmacy on July 12, 2010 1:50 PM  Refills Requested: Medication #1:  BUMEX 2 MG  TABS once daily Please Advise refills  Initial call taken by: Ami Bullins CMA,  July 12, 2010 1:50 PM  Follow-up for Phone Call        ok to refill as needed  Follow-up by: Jacques Navy MD,  July 13, 2010 8:00 AM    Prescriptions: BUMEX 2 MG  TABS (BUMETANIDE) once daily  #30 Tablet x 1   Entered by:   Ami Bullins CMA   Authorized by:   Jacques Navy MD   Signed by:   Bill Salinas CMA on 07/13/2010   Method used:   Telephoned to ...       [facility]  [address] [phone]* (retail)       [address]       [place], [place]  [address]       Ph: [phone] or [phone]       Fax: [phone]   RxID:   [id number]

## 2010-12-12 NOTE — Progress Notes (Signed)
  Phone Note Refill Request Message from:  Fax from Pharmacy on February 13, 2010 11:09 AM  Refills Requested: Medication #1:  BUMEX 2 MG  TABS once daily   Last Refilled: 09/01/2009 Initial call taken by: Ami Bullins CMA,  February 13, 2010 11:09 AM    Prescriptions: BUMEX 2 MG  TABS (BUMETANIDE) once daily  #30 Tablet x 4   Entered by:   Ami Bullins CMA   Authorized by:   Jacques Navy MD   Signed by:   Bill Salinas CMA on 02/13/2010   Method used:   [facility]ronically to        [facility] [phone]* (retail)       [address]       [place], [place]  [address]       Ph: [phone] or [phone]       Fax: [phone]   RxID:   [id number]

## 2010-12-12 NOTE — Progress Notes (Signed)
Summary: Abd pain  Phone Note Call from Patient Call back at Home Phone [phone]   Summary of Call: Patient left message on triage that she is having gastric issues and increased gas causing pain under her ribs. Patient would like call from triage. Initial call taken by: Lucious Groves,  February 23, 2010 4:03 PM  Follow-up for Phone Call        Pt c/o painful "rumbling" off and on x 2 wks. Describes area as center of the chest at "bottom of stomach". Very frequently this happens when she is hungry. Beano and eating will help some. No change in bowel movement, has frequent constipation, uses MOM 2 x's a week. Please advise. Follow-up by: Lamar Sprinkles, CMA,  February 23, 2010 6:46 PM  Additional Follow-up for Phone Call Additional follow up Details #1::        Will need an OV, but while waiting she should start taking otc priolosec 20mg  q AM Additional Follow-up by: Jacques Navy MD,  February 24, 2010 4:16 AM    Additional Follow-up for Phone Call Additional follow up Details #2::    Pt informed  Follow-up by: Lamar Sprinkles, CMA,  February 24, 2010 9:11 AM  New/Updated Medications: OMEPRAZOLE 20 MG CPDR (OMEPRAZOLE) 1 q am Prescriptions: OMEPRAZOLE 20 MG CPDR (OMEPRAZOLE) 1 q am  #30 x 0   Entered by:   Lamar Sprinkles, CMA   Authorized by:   Jacques Navy MD   Signed by:   Lamar Sprinkles, CMA on 02/24/2010   Method used:   [facility]ronically to        [facility]  [address] [phone]* (retail)       [address]       [place], [place]  [address]       Ph: [phone] or [phone]       Fax: [phone]   RxID:   [id number]

## 2010-12-12 NOTE — Progress Notes (Signed)
Summary: Hemorrhoids  ---- Converted from flag ---- ---- 05/24/2010 4:20 PM, Verdell Face wrote: [name] ([id number]) called and wanted to let Dr Debby Bud know she has had a flare-up of hemorroids, pt using supposites twice a day, still hard for her to pass her "gas" waht should she do?  Pt wants a call [phone].   Thanks, Elnita Maxwell ------------------------------  Phone Note From Other Clinic   Summary of Call: Do you want to see pt tomorrow?  Initial call taken by: Lamar Sprinkles, CMA,  May 25, 2010 5:25 PM  Follow-up for Phone Call        ok for appt tomorrow. Follow-up by: Jacques Navy MD,  May 25, 2010 5:41 PM  Additional Follow-up for Phone Call Additional follow up Details #1::        Spoke w/pt, she declined office visit today due to other apts and "things she needs to do". Advised office visit asap if symptoms become worse, pt agreed and will call office back so schedule.  Additional Follow-up by: Lamar Sprinkles, CMA,  May 26, 2010 9:28 AM

## 2010-12-12 NOTE — Progress Notes (Signed)
Summary: ALT MED - [name] pt  Phone Note Call from Patient Call back at [facility] Phone [phone]   Summary of Call: Patient is requesting alt med to the hydrocortisone suppositiories for hemorrhoids. She says current RX has not give much relief.  Has also used prep - H OTC.  Initial call taken by: Lamar Sprinkles, CMA,  May 18, 2010 11:17 AM  Follow-up for Phone Call        Cont HCT supp Stool softner ie Metamucil 2 caps two times a day OV w/Dr Tamon Parkerson next wk Follow-up by: Tresa Garter MD,  May 18, 2010 1:03 PM  Additional Follow-up for Phone Call Additional follow up Details #1::        Pt informed  Additional Follow-up by: Lamar Sprinkles, CMA,  May 18, 2010 4:21 PM

## 2010-12-12 NOTE — Progress Notes (Signed)
  Phone Note Refill Request Message from:  Fax from Pharmacy on Mar 22, 2010 1:11 PM  Refills Requested: Medication #1:  PROMETHAZINE HCL 12.5 MG TABS 1 q 6 as needed nausea Initial call taken by: Rock Nephew CMA,  Mar 22, 2010 1:12 PM    Prescriptions: PROMETHAZINE HCL 12.5 MG TABS (PROMETHAZINE HCL) 1 q 6 as needed nausea  #20 x 0   Entered by:   Rock Nephew CMA   Authorized by:   Jacques Navy MD   Signed by:   Rock Nephew CMA on 03/22/2010   Method used:   [facility] to        [facility]  [address] [phone]* (retail)       [address]       [place], [place]  [address]       Ph: [phone] or [phone]       Fax: [phone]   RxID:   [id number]

## 2010-12-12 NOTE — Assessment & Plan Note (Signed)
Summary: gas--stc   Vital Signs:  Patient profile:   77 year old female Height:      64 inches Weight:      187 pounds BMI:     32.21 O2 Sat:      99 % on Room air Temp:     97.5 degrees F oral Pulse rate:   68 / minute BP sitting:   132 / 68  (left arm) Cuff size:   large  Vitals Entered By: Bill Salinas CMA (May 29, 2010 9:13 AM)  O2 Flow:  Room air CC: pt here with c/o bloating and gas as well as hemorrhoids swelling/ ab   Primary Care Provider:  Norins  CC:  pt here with c/o bloating and gas as well as hemorrhoids swelling/ ab.  History of Present Illness: Has had a flare of hemorrhoid and she had trouble with constipation and flatus. She is feeling bloated and full. She is using hemorhoidal HC 25mg  suppository - no pain or bleeding. Her gas is up high.  Current Medications (verified): 1)  Norvasc 10 Mg  Tabs (Amlodipine Besylate) .... Once Daily 2)  Cozaar 100 Mg  Tabs (Losartan Potassium) .... Once Daily 3)  Bumex 2 Mg  Tabs (Bumetanide) .... Once Daily 4)  Amaryl 4 Mg  Tabs (Glimepiride) .... Am and Pm For Control of Diabetes 5)  Clonidine Hcl 0.1 Mg Tabs (Clonidine Hcl) .Marland Kitchen.. 1 By Mouth Two Times A Day For Blood Pressure 6)  Daily Multiple Vitamins   Tabs (Multiple Vitamin) .... Once Daily 7)  Bl Vitamin C 500 Mg  Tabs (Ascorbic Acid) .... Once Daily 8)  Diclofenac Sodium 50 Mg  Tbec (Diclofenac Sodium) .... Two Times A Day 9)  Simvastatin 40 Mg  Tabs (Simvastatin) .... 1/2 Once Daily 10)  Meclizine Hcl 25 Mg  Tabs (Meclizine Hcl) .... As Needed 11)  Alprazolam 0.25 Mg  Tabs (Alprazolam) .... 1/4  By Mouth Once Daily As Needed 12)  Hemorrhoidal-Hc 25 Mg  Supp (Hydrocortisone Acetate) .... Insert Suppository Two Times A Day As Needed 13)  Promethazine Hcl 12.5 Mg Tabs (Promethazine Hcl) .Marland Kitchen.. 1 Q 6 As Needed Nausea 14)  Promethazine-Codeine 6.25-10 Mg/93ml Syrp (Promethazine-Codeine) .Marland Kitchen.. 1 Tsp Q 6 As Needed Cough 15)  Allopurinol 100 Mg Tabs (Allopurinol) .Marland Kitchen.. 1 By  Mouth Once Daily For Prevention of Gout. 16)  Omeprazole 20 Mg Cpdr (Omeprazole) .Marland Kitchen.. 1 Q Am  Allergies (verified): 1)  ! Cipro 2)  ! Cardizem  Past History:  Past Surgical History: Last updated: 07/22/2007 Tonsillectomy * COLON SURGERY     Past Medical History: DYSPEPSIA (ICD-536.8) CHRONIC KIDNEY DISEASE STAGE I (ICD-585.1) HEAT STROKE (ICD-992.0) * COLON SURGERY HYPERLIPIDEMIA (ICD-272.4) GOUT (ICD-274.9) DIABETES MELLITUS, TYPE II (ICD-250.00) HYPERTENSION (ICD-401.9) Hemorrhoids PSH reviewed for relevance, FH reviewed for relevance  Review of Systems  The patient denies anorexia, fever, weight loss, weight gain, vision loss, chest pain, syncope, dyspnea on exertion, peripheral edema, abdominal pain, hematochezia, and difficulty walking.    Physical Exam  General:  Elderly AA female in no distress Head:  Normocephalic and atraumatic without obvious abnormalities. No apparent alopecia or balding. Eyes:  C&S clear Abdomen:  soft, non-tender, normal bowel sounds, no distention, and no masses.   Psych:  Oriented X3, memory intact for recent and remote, normally interactive, and good eye contact.     Impression & Recommendations:  Problem # 1:  INTERNAL HEMORRHOIDS WITHOUT MENTION COMP (ICD-455.0) Patient with mild bloating and gas due to hemorroids.  Plan -  Mylanta II q 6 as needed for gas and bloating.   Complete Medication List: 1)  Norvasc 10 Mg Tabs (Amlodipine besylate) .... Once daily 2)  Cozaar 100 Mg Tabs (Losartan potassium) .... Once daily 3)  Bumex 2 Mg Tabs (Bumetanide) .... Once daily 4)  Amaryl 4 Mg Tabs (Glimepiride) .... Am and pm for control of diabetes 5)  Clonidine Hcl 0.1 Mg Tabs (Clonidine hcl) .Marland Kitchen.. 1 by mouth two times a day for blood pressure 6)  Daily Multiple Vitamins Tabs (Multiple vitamin) .... Once daily 7)  Bl Vitamin C 500 Mg Tabs (Ascorbic acid) .... Once daily 8)  Diclofenac Sodium 50 Mg Tbec (Diclofenac sodium) .... Two times a  day 9)  Simvastatin 40 Mg Tabs (Simvastatin) .... 1/2 once daily 10)  Meclizine Hcl 25 Mg Tabs (Meclizine hcl) .... As needed 11)  Alprazolam 0.25 Mg Tabs (Alprazolam) .... 1/4  by mouth once daily as needed 12)  Hemorrhoidal-hc 25 Mg Supp (Hydrocortisone acetate) .... Insert suppository two times a day as needed 13)  Promethazine Hcl 12.5 Mg Tabs (Promethazine hcl) .Marland Kitchen.. 1 q 6 as needed nausea 14)  Promethazine-codeine 6.25-10 Mg/71ml Syrp (Promethazine-codeine) .Marland Kitchen.. 1 tsp q 6 as needed cough 15)  Allopurinol 100 Mg Tabs (Allopurinol) .Marland Kitchen.. 1 by mouth once daily for prevention of gout. 16)  Omeprazole 20 Mg Cpdr (Omeprazole) .Marland Kitchen.. 1 q am  Patient Instructions: 1)  Bloating and gas - due to hemorrhoids. Plan - take Mylanta II 1 capful every 4 hours as needed for bloating and gas or constipation. 2)  Blod pressure - 132/68 today = good control.

## 2010-12-12 NOTE — Progress Notes (Signed)
Summary: Refill--Alprazolam  Phone Note Refill Request Message from:  Fax from Pharmacy on May 17, 2010 10:14 AM  Refills Requested: Medication #1:  ALPRAZOLAM 0.25 MG  TABS 1/4  by mouth once daily as needed Initial call taken by: Lucious Groves,  May 17, 2010 10:14 AM  Follow-up for Phone Call        ok to fill as before Follow-up by: Newt Lukes MD,  May 17, 2010 11:09 AM    Prescriptions: ALPRAZOLAM 0.25 MG  TABS (ALPRAZOLAM) 1/4  by mouth once daily as needed  #30 x 5   Entered by:   Lucious Groves   Authorized by:   Newt Lukes MD   Signed by:   Lucious Groves on 05/17/2010   Method used:   [facility]ph[facility] ...       [facility]  [address] [phone][facility] (retail)       [address]       [place], [place]  [address]       Ph: [phone] or [phone]       Fax: [phone]   RxID:   [id number]

## 2010-12-12 NOTE — Assessment & Plan Note (Signed)
Summary: f/u per sarah/cd   Vital Signs:  Patient profile:   77 year old female Height:      64 inches (162.56 cm) Weight:      188 pounds (85.45 kg) BMI:     32.39 O2 Sat:      98 % on Room air Temp:     98.1 degrees F (36.72 degrees C) oral Pulse rate:   78 / minute BP sitting:   142 / 82  (left arm) Cuff size:   large  Vitals Entered By: Brenton Grills (March 02, 2010 9:21 AM)  O2 Flow:  Room air  Primary Care Provider:  Ettie Krontz   History of Present Illness: Patient had called 4/14 c/o abdominal pain made better with eating. she was prescribed priolosec and this has helped the abdominal pain. She thinks the pain may be related to eating fruit. Beano also helps. She has eructation, no hemetemesis. No change in bowel habit, no hematochezia or melena. She does have hemorrhoids.  Patient has been taking her blood pressure medication and has been doing well.  Patient follows with [name] for DM. Her last A1C there was 8.3% and per his office note a repeat was order for March 8th - result not available to me. She reports that her CBGs have been satisfactory.   Current Medications (verified): 1)  Norvasc 10 Mg  Tabs (Amlodipine Besylate) .... Once Daily 2)  Cozaar 100 Mg  Tabs (Losartan Potassium) .... Once Daily 3)  Bumex 2 Mg  Tabs (Bumetanide) .... Once Daily 4)  Amaryl 4 Mg  Tabs (Glimepiride) .... Am and Pm For Control of Diabetes 5)  Clonidine Hcl 0.1 Mg Tabs (Clonidine Hcl) .Marland Kitchen.. 1 By Mouth Two Times A Day For Blood Pressure 6)  Daily Multiple Vitamins   Tabs (Multiple Vitamin) .... Once Daily 7)  Bl Vitamin C 500 Mg  Tabs (Ascorbic Acid) .... Once Daily 8)  Diclofenac Sodium 50 Mg  Tbec (Diclofenac Sodium) .... Two Times A Day 9)  Simvastatin 40 Mg  Tabs (Simvastatin) .... 1/2 Once Daily 10)  Meclizine Hcl 25 Mg  Tabs (Meclizine Hcl) .... As Needed 11)  Alprazolam 0.25 Mg  Tabs (Alprazolam) .... 1/4  By Mouth Once Daily As Needed 12)  Hemorrhoidal-Hc 25 Mg  Supp  (Hydrocortisone Acetate) .... Insert Suppository Two Times A Day As Needed 13)  Promethazine Hcl 12.5 Mg Tabs (Promethazine Hcl) .Marland Kitchen.. 1 Q 6 As Needed Nausea 14)  Promethazine-Codeine 6.25-10 Mg/59ml Syrp (Promethazine-Codeine) .Marland Kitchen.. 1 Tsp Q 6 As Needed Cough 15)  Allopurinol 100 Mg Tabs (Allopurinol) .Marland Kitchen.. 1 By Mouth Once Daily For Prevention of Gout. 16)  Omeprazole 20 Mg Cpdr (Omeprazole) .Marland Kitchen.. 1 Q Am  Allergies (verified): 1)  ! Cipro 2)  ! Cardizem  Past History:  Past Surgical History: Last updated: 07/22/2007 Tonsillectomy * COLON SURGERY     Family History: Last updated: 01/09/2008 Sister - DM Sister - breast cancer - recovered Neg- colon cancer, CAD/MI  Social History: Last updated: 01/09/2008 HSG married '59-widowed '99 1 son - '59; 2 daughter - '69, '79; grandchildren 4; great-grandchildren 3 Lives alone work: [employer] service since '71  Past Medical History: DYSPEPSIA (ICD-536.8) CHRONIC KIDNEY DISEASE STAGE I (ICD-585.1) HEAT STROKE (ICD-992.0) * COLON SURGERY HYPERLIPIDEMIA (ICD-272.4) GOUT (ICD-274.9) DIABETES MELLITUS, TYPE II (ICD-250.00) HYPERTENSION (ICD-401.9)  Review of Systems       The patient complains of abdominal pain and severe indigestion/heartburn.  The patient denies anorexia, fever, weight loss, weight gain,  hoarseness, chest pain, dyspnea on exertion, peripheral edema, headaches, hemoptysis, melena, hematochezia, muscle weakness, difficulty walking, depression, enlarged lymph nodes, and angioedema.    Physical Exam  General:  WNWD, overweight AA female in no distress Head:  normocephalic and atraumatic.   Eyes:  pupils equal, pupils round, corneas and lenses clear, and no injection.   Neck:  supple and full ROM.   Lungs:  normal respiratory effort and normal breath sounds.   Heart:  normal rate, regular rhythm, and no murmur.   Abdomen:  soft, non-tender, normal bowel sounds, no guarding, no rigidity, and no hepatomegaly.   Msk:   normal ROM, no joint tenderness, no joint swelling, no joint warmth, and no joint deformities.   Neurologic:  alert & oriented X3, cranial nerves II-XII intact, strength normal in all extremities, and gait normal.     Impression & Recommendations:  Problem # 1:  DYSPEPSIA (ICD-536.8) pain most likely dyspepsia without ulcer  Plan - continue PPI x 1 month then prn  Problem # 2:  DIABETES MELLITUS, TYPE II (ICD-250.00) Per [name]  Her updated medication list for this problem includes:    Cozaar 100 Mg Tabs (Losartan potassium) ..... Once daily    Amaryl 4 Mg Tabs (Glimepiride) .Marland Kitchen... Am and pm for control of diabetes  Problem # 3:  HYPERTENSION (ICD-401.9)  Her updated medication list for this problem includes:    Norvasc 10 Mg Tabs (Amlodipine besylate) ..... Once daily    Cozaar 100 Mg Tabs (Losartan potassium) ..... Once daily    Bumex 2 Mg Tabs (Bumetanide) ..... Once daily    Clonidine Hcl 0.1 Mg Tabs (Clonidine hcl) .Marland Kitchen... 1 by mouth two times a day for blood pressure  BP today: 142/82 Prior BP: 168/82 (10/21/2009)  Adequate control on present meds. She has routine lab work at Dr. Rinaldo Cloud office re: electrolytes.   Complete Medication List: 1)  Norvasc 10 Mg Tabs (Amlodipine besylate) .... Once daily 2)  Cozaar 100 Mg Tabs (Losartan potassium) .... Once daily 3)  Bumex 2 Mg Tabs (Bumetanide) .... Once daily 4)  Amaryl 4 Mg Tabs (Glimepiride) .... Am and pm for control of diabetes 5)  Clonidine Hcl 0.1 Mg Tabs (Clonidine hcl) .Marland Kitchen.. 1 by mouth two times a day for blood pressure 6)  Daily Multiple Vitamins Tabs (Multiple vitamin) .... Once daily 7)  Bl Vitamin C 500 Mg Tabs (Ascorbic acid) .... Once daily 8)  Diclofenac Sodium 50 Mg Tbec (Diclofenac sodium) .... Two times a day 9)  Simvastatin 40 Mg Tabs (Simvastatin) .... 1/2 once daily 10)  Meclizine Hcl 25 Mg Tabs (Meclizine hcl) .... As needed 11)  Alprazolam 0.25 Mg Tabs (Alprazolam) .... 1/4  by mouth once daily as  needed 12)  Hemorrhoidal-hc 25 Mg Supp (Hydrocortisone acetate) .... Insert suppository two times a day as needed 13)  Promethazine Hcl 12.5 Mg Tabs (Promethazine hcl) .Marland Kitchen.. 1 q 6 as needed nausea 14)  Promethazine-codeine 6.25-10 Mg/10ml Syrp (Promethazine-codeine) .Marland Kitchen.. 1 tsp q 6 as needed cough 15)  Allopurinol 100 Mg Tabs (Allopurinol) .Marland Kitchen.. 1 by mouth once daily for prevention of gout. 16)  Omeprazole 20 Mg Cpdr (Omeprazole) .Marland Kitchen.. 1 q am  Patient Instructions: 1)  Abdominal pain - most likely increased acid which is better with omeprazole. Plan - take omeprazole every day for one month then take only as needed. 2)  Diabetes- follow-up with [name] 3)  High Blood pressure - good control on present medications.

## 2010-12-12 NOTE — Progress Notes (Signed)
  Phone Note Refill Request Message from:  Fax from Pharmacy on January 09, 2010 10:25 AM  Refills Requested: Medication #1:  COZAAR 100 MG  TABS once daily Initial call taken by: Ami Bullins CMA,  January 09, 2010 10:25 AM    Prescriptions: COZAAR 100 MG  TABS (LOSARTAN POTASSIUM) once daily  #90 x 3   Entered by:   Ami Bullins CMA   Authorized by:   Jacques Navy MD   Signed by:   Bill Salinas CMA on 01/09/2010   Method used:   [facility]        [facility] [phone][facility] (retail)       [address]       [place], [place]  [address]       Ph: [phone] or [phone]       Fax: [phone]   RxID:   [id number]

## 2010-12-14 NOTE — Progress Notes (Signed)
Summary: Diclofenac  Phone Note Call from Patient Call back at Home Phone [phone]   Summary of Call: Patient is requesting refill of diclofenac. Med was removed from list on 08/2010. Pt was advised to hold med due to possible cause of GI upset. Pt forgot about this and states she takes it every other day.   Per pt, gas med and omeprazole has helped w/stomach discomfort. OK to resume diclofenac?  Initial call taken by: Lamar Sprinkles, CMA,  October 27, 2010 9:58 AM  Follow-up for Phone Call        prefer to try meloxicam 7.5 mg once a day - less risk of gastric irritation. # 30 refill as needed., Follow-up by: Jacques Navy MD,  October 27, 2010 1:00 PM  Additional Follow-up for Phone Call Additional follow up Details #1::        Pt informed  Additional Follow-up by: Lamar Sprinkles, CMA,  October 27, 2010 2:41 PM    New/Updated Medications: MELOXICAM 7.5 MG TABS (MELOXICAM) 1 once daily as needed pain Prescriptions: MELOXICAM 7.5 MG TABS (MELOXICAM) 1 once daily as needed pain  #30 x 12   Entered by:   Lamar Sprinkles, CMA   Authorized by:   Jacques Navy MD   Signed by:   Lamar Sprinkles, CMA on 10/27/2010   Method used:   Electronically to        [facility]  [address] [phone]* (retail)       [address]       [place], [place]  [address]       Ph: [phone] or [phone]       Fax: [phone]   RxID:   [id number]

## 2010-12-14 NOTE — Progress Notes (Signed)
  Phone Note Refill Request Message from:  Fax from Pharmacy on November 20, 2010 2:23 PM  Refills Requested: Medication #1:  PROMETHAZINE-CODEINE 6.25-10 MG/5ML SYRP 1 tsp q 6 as needed cough   Last Refilled: 10/24/2009 [facility] [address] please advise refills  Initial call taken by: Ami Bullins CMA,  November 20, 2010 2:24 PM  Follow-up for Phone Call        ok for refill x2 Follow-up by: Jacques Navy MD,  November 21, 2010 9:33 AM    Prescriptions: PROMETHAZINE-CODEINE 6.25-10 MG/5ML SYRP (PROMETHAZINE-CODEINE) 1 tsp q 6 as needed cough  #120cc x 2   Entered by:   Bill Salinas CMA   Authorized by:   Jacques Navy MD   Signed by:   Bill Salinas CMA on 11/21/2010   Method used:   Telephoned to ...       [facility]  [address] [phone]* (retail)       [address]       [place], [place]  [address]       Ph: [phone] or [phone]       Fax: [phone]   RxID:   [id number]

## 2010-12-18 ENCOUNTER — Telehealth: Payer: Self-pay | Admitting: Internal Medicine

## 2010-12-28 NOTE — Progress Notes (Signed)
Summary: RX for Gout  Phone Note Call from Patient   Summary of Call: Patient is requesting a call back regarding an rx.  Initial call taken by: Lamar Sprinkles, CMA,  December 18, 2010 9:58 AM  Follow-up for Phone Call        # busy.................Marland KitchenLamar Sprinkles, CMA  December 18, 2010 2:32 PM   Patient is requesting refill of indomethacin for gout flare?  Follow-up by: Lamar Sprinkles, CMA,  December 19, 2010 9:55 AM  Additional Follow-up for Phone Call Additional follow up Details #1::        ok for indomethacin. did rx. called pt  Additional Follow-up by: Jacques Navy MD,  December 19, 2010 5:42 PM    New/Updated Medications: INDOMETHACIN 25 MG CAPS (INDOMETHACIN) 1 by mouth three times a day for flare of gout Prescriptions: INDOMETHACIN 25 MG CAPS (INDOMETHACIN) 1 by mouth three times a day for flare of gout  #30 x 2   Entered and Authorized by:   Jacques Navy MD   Signed by:   Jacques Navy MD on 12/19/2010   Method used:   Electronically to        [facility]  [address] [phone]* (retail)       [address]       [place], [place]  [address]       Ph: [phone] or [phone]       Fax: [phone]   RxID:   [id number]

## 2011-01-11 ENCOUNTER — Encounter: Payer: Self-pay | Admitting: Internal Medicine

## 2011-01-15 ENCOUNTER — Telehealth: Payer: Self-pay | Admitting: Internal Medicine

## 2011-01-19 ENCOUNTER — Telehealth: Payer: Self-pay | Admitting: Internal Medicine

## 2011-01-23 NOTE — Progress Notes (Signed)
  Phone Note Refill Request Message from:  Fax from Pharmacy on January 15, 2011 3:34 PM  Refills Requested: Medication #1:  ALPRAZOLAM 0.25 MG  TABS 1/4  by mouth once daily as needed [facility] [address]  Initial call taken by: Ami Bullins CMA,  January 15, 2011 3:34 PM  Follow-up for Phone Call        ok to refill x 5 Follow-up by: Jacques Navy MD,  January 15, 2011 6:17 PM    Prescriptions: ALPRAZOLAM 0.25 MG  TABS (ALPRAZOLAM) 1/4  by mouth once daily as needed  #30 x 5   Entered by:   Ami Bullins CMA   Authorized by:   Jacques Navy MD   Signed by:   Bill Salinas CMA on 01/16/2011   Method used:   Telephoned to ...       [facility]  [address] [phone]* (retail)       [address]       [place], [place]  [address]       Ph: [phone] or [phone]       Fax: [phone]   RxID:   [id number]

## 2011-01-23 NOTE — Progress Notes (Signed)
  Phone Note Refill Request Message from:  Fax from Pharmacy on January 19, 2011 11:17 AM  Refills Requested: Medication #1:  PROMETHAZINE HCL 12.5 MG TABS 1 q 6 as needed nausea please advise refills  Initial call taken by: Ami Bullins CMA,  January 19, 2011 11:17 AM  Follow-up for Phone Call        ok for refill  2 Follow-up by: Jacques Navy MD,  January 19, 2011 1:04 PM    Prescriptions: PROMETHAZINE HCL 12.5 MG TABS (PROMETHAZINE HCL) 1 q 6 as needed nausea  #20 x 2   Entered by:   Ami Bullins CMA   Authorized by:   Jacques Navy MD   Signed by:   Bill Salinas CMA on 01/19/2011   Method used:   Electronically to        [facility]  [address] [phone]* (retail)       [address]       [place], [place]  [id number]       Ph: [phone] or [phone]       Fax: [phone]   RxID:   [id number]

## 2011-02-06 ENCOUNTER — Emergency Department (HOSPITAL_COMMUNITY)
Admission: EM | Admit: 2011-02-06 | Discharge: 2011-02-06 | Disposition: A | Discharge status: Home / Self Care | Payer: Medicare Other | Attending: Emergency Medicine | Admitting: Emergency Medicine

## 2011-02-06 ENCOUNTER — Emergency Department (HOSPITAL_COMMUNITY): Payer: Medicare Other

## 2011-02-06 DIAGNOSIS — E119 Type 2 diabetes mellitus without complications: Secondary | ICD-10-CM | POA: Insufficient documentation

## 2011-02-06 DIAGNOSIS — R5381 Other malaise: Secondary | ICD-10-CM | POA: Insufficient documentation

## 2011-02-06 DIAGNOSIS — I10 Essential (primary) hypertension: Secondary | ICD-10-CM | POA: Insufficient documentation

## 2011-02-06 DIAGNOSIS — N390 Urinary tract infection, site not specified: Secondary | ICD-10-CM | POA: Insufficient documentation

## 2011-02-06 DIAGNOSIS — R799 Abnormal finding of blood chemistry, unspecified: Secondary | ICD-10-CM | POA: Insufficient documentation

## 2011-02-06 LAB — COMPREHENSIVE METABOLIC PANEL
ALT: 22 U/L (ref 0–35)
Albumin: 4.3 g/dL (ref 3.5–5.2)
Alkaline Phosphatase: 146 U/L — ABNORMAL HIGH (ref 39–117)
Chloride: 94 mEq/L — ABNORMAL LOW (ref 96–112)
GFR calc Af Amer: 40 mL/min — ABNORMAL LOW (ref >60)
Glucose, Bld: 224 mg/dL — ABNORMAL HIGH (ref 70–99)
Potassium: 4.1 mEq/L (ref 3.5–5.1)
Sodium: 129 mEq/L — ABNORMAL LOW (ref 135–145)
Total Bilirubin: 0.4 mg/dL (ref 0.3–1.2)
Total Protein: 9 g/dL — ABNORMAL HIGH (ref 6.0–8.3)

## 2011-02-06 LAB — DIFFERENTIAL
Eosinophils Absolute: 0.2 10*3/uL (ref 0.0–0.7)
Eosinophils Relative: 3 % (ref 0–5)
Lymphs Abs: 2.1 10*3/uL (ref 0.7–4.0)
Monocytes Relative: 7 % (ref 3–12)

## 2011-02-06 LAB — URINALYSIS, ROUTINE W REFLEX MICROSCOPIC
Glucose, UA: NEGATIVE mg/dL
Specific Gravity, Urine: 1.012 (ref 1.005–1.030)
Urobilinogen, UA: 0.2 mg/dL (ref 0.0–1.0)
pH: 5.5 (ref 5.0–8.0)

## 2011-02-06 LAB — CBC
HCT: 35.5 % — ABNORMAL LOW (ref 36.0–46.0)
Hemoglobin: 12.2 g/dL (ref 12.0–15.0)
RDW: 12.7 % (ref 11.5–15.5)
WBC: 8.6 10*3/uL (ref 4.0–10.5)

## 2011-02-06 LAB — URINE MICROSCOPIC-ADD ON

## 2011-02-06 LAB — GLUCOSE, CAPILLARY: Glucose-Capillary: 268 mg/dL — ABNORMAL HIGH (ref 70–99)

## 2011-02-08 LAB — URINE CULTURE

## 2011-02-08 NOTE — Letter (Signed)
Summary: [facility]   Imported By: Lennie Odor 01/29/2011 15:59:47  _____________________________________________________________________  External Attachment:    Type:   Image     Comment:   External Document

## 2011-02-12 ENCOUNTER — Other Ambulatory Visit: Payer: Self-pay | Admitting: Internal Medicine

## 2011-02-19 ENCOUNTER — Telehealth: Payer: Self-pay | Admitting: *Deleted

## 2011-02-19 NOTE — Telephone Encounter (Signed)
[facility] [address] [facility] hydrocortisone AC 25mg  supp. QTY of 12  SIG insert one suppository rectally two times a day prn. Last fill was 08/04/2010, Please Advise refill

## 2011-02-19 NOTE — Telephone Encounter (Signed)
Ok for refill  x3 

## 2011-02-20 MED ORDER — HYDROCORTISONE ACETATE 25 MG RE SUPP: 25.0000 mg | Freq: Two times a day (BID) | RECTAL | Status: DC

## 2011-03-11 ENCOUNTER — Other Ambulatory Visit: Payer: Self-pay | Admitting: Internal Medicine

## 2011-03-29 ENCOUNTER — Encounter: Payer: Self-pay | Admitting: Internal Medicine

## 2011-03-30 NOTE — Assessment & Plan Note (Signed)
[facility]                             PRIMARY CARE OFFICE NOTE   VENDELA, TROUNG                      MRN:          [id number]  DATE:06/21/2006                            DOB:          1925/11/17    [name] is a 77 year old widowed African-American woman who presents  today for followup evaluation and exam.  She was last seen in the office  November 23, 2005 for UTI and upper respiratory infection.  In the interval,  the patient has been seen by Dr. Adrian Prince recently and was found to be  doing well.  He follows her diabetes and follows hemoglobin A1Cs by her  report.  She has been well controlled.  [name] also follows for lipid.  He was kind enough to send labs from December 14, 2005 which revealed a  glucose of 102, normal chemistries, normal CBC, cholesterol was 95,  triglycerides 154, HDL was 40, LDL was 24.  TSH was 0.57.  Liver functions  were normal.  Patient had no significant microalbuminuria.   PAST MEDICAL HISTORY:   SURGICAL:  Tonsillectomy, remote.  Colon surgeries in the 70s for  unspecified reasons.   GYNECOLOGICAL:  Patient is a gravida 3, para 3.   MEDICAL ILLNESS:  1. Usual childhood disease.  2. Non-insulin-dependent diabetes.  3. Hypertension.  4. Gout.  5. Hyperlipidemia.   CURRENT MEDICATIONS:  1. Norvasc 10 mg daily.  2. Cozaar 100 mg daily.  3. Bumex 1 mg daily.  4. Amaryl 4 mg daily.  5. Catapres TTS patch 0.2 mg applied weekly.  6. Diclofenac 50 mg b.i.d.  7. Simvastatin 20 mg daily.  8. Advil p.r.n.  9. Meclizine p.r.n.   FAMILY HISTORY:  Noncontributory.   SOCIAL HISTORY:  The patient is a widow.  Her children are close to her as  are her four sisters, as are her grandchildren and she has several great-  grandchildren.  The patient now has 36-years of employment at [employer] and continues to work.  She is very well-adjusted and seems to be  doing well.   CHART REVIEW:  Past  colonoscopy May 18, 2005 and this was a normal study.  She would be a candidate for followup in 10 years.  Past stress Cardiolite  study May 22, 2004 which was a negative study with no abnormalities, with a  well-preserved and normal ejection fraction.  Her last renal artery and  abdominal ultrasound showed normal velocities in the absence of renal artery  stenosis.  Last mammogram from July 27, 2005 - this was a negative  study.  Last 12-lead electrocardiogram from May 17, 2004 which showed a  sinus rhythm with a question of an old anterior infarction.   REVIEW OF SYSTEMS:  Negative for any constitutional cardiovascular,  respiratory, GI or GU complaints.   EXAMINATION:  VITAL SIGNS:  Temperature was 98.7, blood pressure was 157/86,  pulse 95, weight 192.  GENERAL:  This is an overweight, heavy-set African-American women in no  acute distress.  HEENT:  Normocephalic, atraumatic.  EACs and TMs were unremarkable.  Oropharynx with dentition in good repair.  No buccal or palate lesions were  noted.  Posterior pharynx was clear.  Conjunctivae and sclerae were clear.  PERRLA, EOMI.  Funduscopic exam was unremarkable.  NECK:  Supple without thyromegaly.  NODES:  No adenopathy was noted in the cervical or supraclavicular regions.  CHEST:  No CVA tenderness.  LUNGS:  Clear to auscultation and percussion.  BREASTS:  Skin was normal, nipples without discharge, breasts were  pendulous.  There were no fixed masses, lesions or abnormalities noted.  CARDIOVASCULAR:  2+ radial pulses, no JVD or carotid bruits.  She had a  quiet precordium with a regular rate and rhythm without murmurs, rubs or  gallops.  ABDOMEN:  Obese, soft, no guarding or rebound.  No organosplenomegaly was  noted.  PELVIC:  Deferred.  RECTAL:  Deferred.  EXTREMITIES:  Without clubbing, cyanosis, edema or deformity.  NEUROLOGICAL:  Including sensation was deferred to [name].  SKIN:  Clear.   LABORATORY DATA:  As  noted.   ASSESSMENT/PLAN:  1. Hypertension.  Patient's blood pressure is borderline controlled.  At      this time we will continue her present medications without a change      given that on chart review her blood pressure is variable in the      office. Patient should check her blood pressures at her convenience at      outside facilities such as drug stores or supermarkets.  2. Diabetes under the care of Dr. Ardyth Harps and evidentially well-      controlled.  3. Lipids.  The patient had an excellent response to Simvastatin.  See      laboratory above.  4. Gout.  Stable.  5. __________.  The patient is currently up-to-date with an exam coming up      later this month.  6. Health maintenance:  The patient has had mammography.  She is aged-out      for pelvic and Pap.   SUMMARY:  A very pleasant woman who seems to be medically stable at this  time.  I have asked her to return to see me on a p.r.n. basis.                                   Rosalyn Gess Norins, MD   MEN/MedQ  DD:  06/22/2006  DT:  06/23/2006  Job #:  [id number]   cc:   Charline Bills A. Evlyn Kanner, MD

## 2011-03-30 NOTE — Assessment & Plan Note (Signed)
[facility]                           PRIMARY CARE OFFICE NOTE   NAME:[name], [name]                      MRN:          [id number]  DATE:11/18/2006                            DOB:          August 13, 1926    HISTORY OF PRESENT ILLNESS:  [name] is a 77 year old African  American woman followed for hypertension, insulin-dependent diabetes,  hyperlipidemia, who presents today for concern about palpitations.   The patient reports that last week she had an episode where she had  three irregular heart beats in a row.  She had no chest pain.  No chest  discomfort.  No syncope.  No near syncope.  No shortness of breath or  any other cardiac symptoms.  She has not had a recurrence but she is  concerned.  She reports she may have had some palpitations many years  ago.  Her husband died, but has had no palpitations since that time.  She has no history of cardiovascular disease.  In reviewing her chart,  she had a stress Cardiolite study July 2005 which was a normal study  with no evidence of ischemia, and a normal ejection fraction was noted.   The patient also complained of having a problem with short term memory  loss.  She reports that she had a problem with her memory for some time,  but she feels it is getting worse and is inquiring as to whether there  is something that can be done for this.   The patient follows with Dr. Ardyth Harps on a regular basis for her  diabetes.  She is followed by Dr. Pearlean Brownie for ophthalmology, Dr. Boston Service for Urology.   CURRENT MEDICATIONS:  1. Norvasc 10 mg daily.  2. Cozaar 100 mg daily.  3. Bumex 1 mg daily.  4. Amaryl 4 mg daily.  5. Catapres TTS patch 0.2 mg applied weekly.  6. Diclofenac 50 mg bid  7. Simvastatin 10 mg every p.m.  8. Meclizine as needed.   REVIEW OF SYSTEMS:  Negative for Constitutional, Cardiovascular,  Respiratory, GI or GU problems except as above.   PHYSICAL EXAMINATION:  VITAL  SIGNS:  Temperature 98.1, blood pressure  184/94, pulse 84, weight 195.  GENERAL APPEARANCE:  This is a heavy set African American woman in no  acute distress.  CARDIOVASCULAR:  Radial pulses 2+ noted.  She had a quiet precordium.  She had a regular rate and rhythm with one PVC noted.   MINI MENTAL STATUS EXAM:  Patient performed poorly.  She is oriented to  day, date and year.  She could not name the president.  She was weak on  current events.  The patient could repeat four numbers in a forward  order.  She had great difficulty repeating any number in reverse order.  She was unable to spell the word world backwards.  The patient could  not do subtractions.  She was unable to do a change making calculation.  She had normal judgment for mail and emergency.  Her abstract  interpretations were quite literal.  She was 0/3 at 5 minutes  per recall  three objects.   ASSESSMENT/PLAN:  1. Hypertension.  The patient is poorly controlled.  At this time, she      will continue her multiple medical regimen that will increase her      Bumex to 2 mg daily.  A new prescription is provided.  2. Cardiovascular.  Patient with palpitations but a normal EKG except      for first degree AV block.  Her auscultation was normal except for      rare PVC's.  If the patient has continued problems, would need to      consider a Holter or event recorder.  3. Memory loss.  The patient performed poorly on a mini mental status      exam.  I do believe she is having significant  problems with      cognition and short term recall.   PLAN:  The patient is provided with a Namenda starter pack and will  start on Namenda and return to see me in four weeks for follow up and  toleration of medication.   For the patient's other medical problems, she is sent to laboratory  today to get a lipid panel, comprehensive metabolic panel, hemoglobin  A1C.     Rosalyn Gess Norins, MD  Electronically Signed    MEN/MedQ  DD:  11/18/2006  DT: 11/18/2006  Job #: [id number]   cc:   Jeannett Senior A. Evlyn Kanner, M.D.  Elwyn Lade

## 2011-04-10 ENCOUNTER — Encounter: Payer: Medicare Other | Admitting: Internal Medicine

## 2011-05-07 ENCOUNTER — Encounter: Payer: Self-pay | Admitting: Internal Medicine

## 2011-05-08 ENCOUNTER — Ambulatory Visit (INDEPENDENT_AMBULATORY_CARE_PROVIDER_SITE_OTHER): Payer: Medicare Other | Admitting: Internal Medicine

## 2011-05-08 VITALS — BP 156/92 | HR 78 | Temp 97.0°F | Ht 62.0 in | Wt 181.0 lb

## 2011-05-08 DIAGNOSIS — Z0000 Encounter for general adult medical examination without abnormal findings: Secondary | ICD-10-CM

## 2011-05-08 DIAGNOSIS — E785 Hyperlipidemia, unspecified: Secondary | ICD-10-CM

## 2011-05-08 DIAGNOSIS — Z136 Encounter for screening for cardiovascular disorders: Secondary | ICD-10-CM

## 2011-05-08 DIAGNOSIS — E119 Type 2 diabetes mellitus without complications: Secondary | ICD-10-CM

## 2011-05-08 DIAGNOSIS — I10 Essential (primary) hypertension: Secondary | ICD-10-CM

## 2011-05-08 NOTE — Progress Notes (Signed)
Subjective:    Patient ID: [name], female    DOB: 01-17-1926, 77 y.o.   MRN: [id number]  HPI   The patient is here for annual Medicare wellness examination and management of other chronic and acute problems. She does follow routinely with [name] and he has been monitoring labs. She has been doing well.    The risk factors are reflected in the social history.  The roster of all physicians providing medical care to patient - is listed in the Snapshot section of the chart.  Activities of daily living:  The patient is 100% inedpendent in all ADLs: dressing, toileting, feeding as well as independent mobility  Home safety : The patient has smoke detectors in the home. They wear seatbelts. No firearms at home . There is no violence in the home.   There is no risks for hepatitis, STDs or HIV. There is no   history of blood transfusion. They have no travel history to infectious disease endemic areas of the world.  The patient has not seen their dentist in the last six month. They have seen their eye doctor in the last year. They deny any hearing difficulty and have not had audiologic testing in the last year.  They do not  have excessive sun exposure. Discussed the need for sun protection: hats, long sleeves and use of sunscreen if there is significant sun exposure.   Diet: the importance of a healthy diet is discussed. They do have a healthy diabetic  diet.  The patient has a regular exercise program: cardio , 10 minute duration, 7 days per week.  The benefits of regular aerobic exercise were discussed.  Depression screen: there are no signs or vegative symptoms of depression- irritability, change in appetite, anhedonia, sadness/tearfullness.  Cognitive assessment: the patient manages all their financial and personal affairs and is actively engaged. They could relate day,date,year and events; recalled 3/3 objects at 3 minutes.  The following portions of the patient's history were  reviewed and updated as appropriate: allergies, current medications, past family history, past medical history,  past surgical history, past social history  and problem list.  Vision, hearing, body mass index were assessed and reviewed.   During the course of the visit the patient was educated and counseled about appropriate screening and preventive services including : fall prevention , diabetes screening, nutrition counseling, colorectal cancer screening, and recommended immunizations.  Past Medical History  Diagnosis Date  . Dyspepsia and other specified disorders of function of stomach   . Chronic kidney disease, stage I   . Heat stroke and sunstroke   . Other and unspecified hyperlipidemia   . Gout, unspecified   . Type II or unspecified type diabetes mellitus without mention of complication, not stated as uncontrolled   . Unspecified essential hypertension   . Hemorrhoid    Past Surgical History  Procedure Date  . Colon surgery   . Tonsillectomy    Family History  Problem Relation Age of Onset  . Diabetes Sister   . Coronary artery disease Neg Hx   . Heart attack Neg Hx   . Cancer Sister     breast- recovered   History   Social History  . Marital Status: Widowed    Spouse Name: N/A    Number of Children: N/A  . Years of Education: N/A   Occupational History  . [employer] service [employer]   Social History Main Topics  . Smoking status: Never Smoker   . Smokeless tobacco: Never  Used  . Alcohol Use: No  . Drug Use: No  . Sexually Active: No   Other Topics Concern  . Not on file   Social History Narrative   HSG. married '59-widowed '99.  1 son - '59; 2 daughter - '69, '79; grandchildren 4; great-grandchildren 3. Lives alone. work: [employer] since '71-retired '11.       Review of Systems Review of Systems  Constitutional:  Negative for fever, chills, activity change and unexpected weight change.  HEENT:  Negative for hearing loss, ear pain, congestion,  neck stiffness and postnasal drip. Negative for sore throat or swallowing problems. Negative for dental complaints.   Eyes: Negative for vision loss or change in visual acuity.  Respiratory: Negative for chest tightness and wheezing.   Cardiovascular: Negative for chest pain and palpitation. No decreased exercise tolerance Gastrointestinal: No change in bowel habit. No bloating or gas. No reflux or indigestion Genitourinary: Negative for urgency, frequency, flank pain and difficulty urinating.  Musculoskeletal: Negative for myalgias, back pain, arthralgias and gait problem.  Neurological: Negative for dizziness, tremors, weakness and headaches.  Hematological: Negative for adenopathy.  Psychiatric/Behavioral: Negative for behavioral problems and dysphoric mood.       Objective:   Physical Exam Vitals reviewed: hemodynamically stable. MIldly overweight. Gen'l: well nourished, well developed     Woman in no distress HEENT - [place]/AT, EACs/TMs normal, oropharynx with native dentition in good condition, no buccal or palatal lesions, posterior pharynx clear, mucous membranes moist. C&S clear, PERRLA, fundi - normal Neck - supple, no thyromegaly Nodes- negative submental, cervical, supraclavicular regions Chest - no deformity, no CVAT Lungs - cleat without rales, wheezes. No increased work of breathing Breast - skin is normal, nipples w/o discharge, no fixed mass or lesion but tender to exam, no axillary adenopathy Cardiovascular - regular rate and rhythm, quiet precordium, no murmurs, rubs or gallops, 2+ radial, DP and PT pulses Abdomen - BS+ x 4, no HSM, no guarding or rebound or tenderness Pelvic - deferrer to age and negative history Rectal - deferred  Extremities - no clubbing, cyanosis, edema or deformity.  Neuro - A&O x 3, CN II-XII normal, motor strength normal and equal, DTRs 2+ and symmetrical biceps, radial, and patellar tendons. Cerebellar - no tremor, no rigidity, fluid movement and  normal gait. Derm - Head, neck, back, abdomen and extremities without suspicious lesions           Assessment & Plan:

## 2011-05-09 ENCOUNTER — Encounter: Payer: Self-pay | Admitting: Internal Medicine

## 2011-05-09 DIAGNOSIS — Z0000 Encounter for general adult medical examination without abnormal findings: Secondary | ICD-10-CM | POA: Insufficient documentation

## 2011-05-09 NOTE — Assessment & Plan Note (Signed)
Interval history is unremarkable. Her exam is normal. She needs to have mammography every other year. She is current with colonoscopy with last study in '06. Immunizations: current with tetnus and pneumonia vaccine. She is a candidate for shingles vaccine.  In summary - a very nice woman who appears to be medically stable at this time. She will monitor her BP. She will continue to be active. She will return prn or in 1 year.

## 2011-05-09 NOTE — Assessment & Plan Note (Signed)
Followed by [name]. Last A1C in our system from '10 = 7.9%. She reports that she has been well controlled. She is current with eye exams. She had normal sensation right foot.   Plan - continue to follow with [name]

## 2011-05-09 NOTE — Assessment & Plan Note (Signed)
Some variability but generally good control. NO change in regimen

## 2011-05-09 NOTE — Assessment & Plan Note (Signed)
Last lab by [name] '11 with LDL 46. Good control.  Plan - continue present regimen

## 2011-05-15 ENCOUNTER — Other Ambulatory Visit: Payer: Self-pay | Admitting: Internal Medicine

## 2011-06-16 ENCOUNTER — Other Ambulatory Visit: Payer: Self-pay | Admitting: Internal Medicine

## 2011-07-14 ENCOUNTER — Other Ambulatory Visit: Payer: Self-pay | Admitting: Internal Medicine

## 2011-07-25 ENCOUNTER — Other Ambulatory Visit: Payer: Self-pay | Admitting: Internal Medicine

## 2011-08-08 ENCOUNTER — Other Ambulatory Visit: Payer: Self-pay | Admitting: Internal Medicine

## 2011-09-15 ENCOUNTER — Other Ambulatory Visit: Payer: Self-pay | Admitting: Internal Medicine

## 2011-10-13 ENCOUNTER — Other Ambulatory Visit: Payer: Self-pay | Admitting: Internal Medicine

## 2011-10-18 ENCOUNTER — Telehealth: Payer: Self-pay | Admitting: *Deleted

## 2011-10-18 MED ORDER — ALPRAZOLAM 0.25 MG PO TABS: 0.2500 mg | ORAL_TABLET | ORAL | Status: DC

## 2011-10-18 NOTE — Telephone Encounter (Signed)
Pt requesting refill on alprazolam, please advise refills

## 2011-10-18 NOTE — Telephone Encounter (Signed)
Ok x 5 

## 2011-11-15 ENCOUNTER — Other Ambulatory Visit: Payer: Self-pay | Admitting: *Deleted

## 2011-11-15 MED ORDER — CLONIDINE HCL 0.1 MG PO TABS: 0.1000 mg | ORAL_TABLET | Freq: Two times a day (BID) | ORAL | Status: DC

## 2011-11-15 MED ORDER — AMLODIPINE BESYLATE 10 MG PO TABS: 10.0000 mg | ORAL_TABLET | Freq: Every day | ORAL | Status: DC

## 2011-11-15 NOTE — Telephone Encounter (Signed)
Done

## 2011-12-15 ENCOUNTER — Other Ambulatory Visit: Payer: Self-pay | Admitting: Internal Medicine

## 2012-01-11 ENCOUNTER — Other Ambulatory Visit: Payer: Self-pay | Admitting: Internal Medicine

## 2012-01-24 ENCOUNTER — Other Ambulatory Visit: Payer: Self-pay | Admitting: *Deleted

## 2012-01-24 NOTE — Telephone Encounter (Signed)
Ok for refill 180 ml. 1 refill

## 2012-01-24 NOTE — Telephone Encounter (Signed)
Request for refill on promethazine-codeine cough syrup. Please advise.

## 2012-01-25 MED ORDER — PROMETHAZINE-CODEINE 6.25-10 MG/5ML PO SYRP: 5.0000 mL | ORAL_SOLUTION | Freq: Four times a day (QID) | ORAL | Status: DC | PRN

## 2012-01-29 ENCOUNTER — Encounter: Payer: Self-pay | Admitting: Endocrinology

## 2012-01-29 ENCOUNTER — Ambulatory Visit (INDEPENDENT_AMBULATORY_CARE_PROVIDER_SITE_OTHER)
Admission: RE | Admit: 2012-01-29 | Discharge: 2012-01-29 | Disposition: A | Discharge status: Home / Self Care | Payer: Medicare Other | Source: Ambulatory Visit | Attending: Endocrinology | Admitting: Endocrinology

## 2012-01-29 ENCOUNTER — Ambulatory Visit (INDEPENDENT_AMBULATORY_CARE_PROVIDER_SITE_OTHER): Payer: Medicare Other | Admitting: Endocrinology

## 2012-01-29 VITALS — BP 148/90 | HR 104 | Temp 97.8°F | Ht 62.0 in | Wt 186.0 lb

## 2012-01-29 DIAGNOSIS — J209 Acute bronchitis, unspecified: Secondary | ICD-10-CM

## 2012-01-29 DIAGNOSIS — R05 Cough: Secondary | ICD-10-CM

## 2012-01-29 DIAGNOSIS — I10 Essential (primary) hypertension: Secondary | ICD-10-CM

## 2012-01-29 MED ORDER — CEFUROXIME AXETIL 250 MG PO TABS: 250.0000 mg | ORAL_TABLET | Freq: Two times a day (BID) | ORAL | Status: AC

## 2012-01-29 NOTE — Patient Instructions (Addendum)
here is a sample of "[id number]."  take 1 puff 2x a day.  rinse mouth after using. A chest-x-ray is requested for you today.  You will receive a letter with results. i have sent a prescription to your pharmacy, for an antibiotic pill.   I hope you feel better soon.  If you don't feel better by next week, please call dr Debby Bud.   (see letter)

## 2012-01-29 NOTE — Progress Notes (Signed)
Subjective:    Patient ID: [name], female    DOB: [birth year], 78 y.o.   MRN: [id number]  HPI Pt states 10 days of moderate prod-quality cough in the chest, and assoc wheezing.  Cough syrup helps.   Past Medical History  Diagnosis Date  . Dyspepsia and other specified disorders of function of stomach   . Chronic kidney disease, stage I   . Heat stroke and sunstroke   . Other and unspecified hyperlipidemia   . Gout, unspecified   . Type II or unspecified type diabetes mellitus without mention of complication, not stated as uncontrolled   . Unspecified essential hypertension   . Hemorrhoid     Past Surgical History  Procedure Date  . Colon surgery   . Tonsillectomy     History   Social History  . Marital Status: Widowed    Spouse Name: N/A    Number of Children: N/A  . Years of Education: N/A   Occupational History  . food service [employer]   Social History Main Topics  . Smoking status: Never Smoker   . Smokeless tobacco: Never Used  . Alcohol Use: No  . Drug Use: No  . Sexually Active: No   Other Topics Concern  . Not on file   Social History Narrative   HSG. married '59-widowed '99.  1 son - '59; 2 daughter - '69, '79; grandchildren 4; great-grandchildren 3. Lives alone. work: [employer] since '71-retired '11.    Current Outpatient Prescriptions on File Prior to Visit  Medication Sig Dispense Refill  . allopurinol (ZYLOPRIM) 100 MG tablet Take 100 mg by mouth daily.        Marland Kitchen ALPRAZolam (XANAX) 0.25 MG tablet Take 1 tablet (0.25 mg total) by mouth as directed. 1/4 daily prn  30 tablet  5  . amLODipine (NORVASC) 10 MG tablet Take 1 tablet (10 mg total) by mouth daily.  90 tablet  1  . bumetanide (BUMEX) 2 MG tablet TAKE 1 TABLET BY MOUTH EVERY DAY  30 tablet  1  . cloNIDine (CATAPRES) 0.1 MG tablet Take 1 tablet (0.1 mg total) by mouth 2 (two) times daily. FOR BLOOD PRESSURE.  60 tablet  5  . diclofenac (VOLTAREN) 50 MG EC tablet TAKE 1 TABLET BY  MOUTH TWICE A DAY  180 tablet  0  . glimepiride (AMARYL) 4 MG tablet TAKE 1 TABLET BY MOUTH IN THE MORNING AND 1 EVERY EVENING  60 tablet  5  . losartan (COZAAR) 100 MG tablet TAKE 1 TABLET BY MOUTH EVERY DAY  90 tablet  1  . meclizine (ANTIVERT) 25 MG tablet TAKE 1 TABLET BY MOUTH AS NEEDED  30 tablet  3  . MULTIPLE VITAMIN PO Take 1 tablet by mouth daily.        Marland Kitchen omeprazole (PRILOSEC) 20 MG capsule TAKE 2 CAPSULES BY MOUTH EVERY MORNING  180 capsule  3  . promethazine (PHENERGAN) 12.5 MG tablet TAKE 1 TABLET BY MOUTH EVERY 6 HOURS AS NEEDED NAUSEA  20 tablet  2  . promethazine-codeine (PHENERGAN WITH CODEINE) 6.25-10 MG/5ML syrup Take 5 mLs by mouth every 6 (six) hours as needed.  180 mL  1  . vitamin C (ASCORBIC ACID) 500 MG tablet Take 500 mg by mouth daily.        . hydrocortisone (ANUSOL-HC) 25 MG suppository Place 1 suppository (25 mg total) rectally every 12 (twelve) hours.  12 suppository  1  . indomethacin (INDOCIN) 25 MG capsule Take  25 mg by mouth 3 (three) times daily with meals.        . meloxicam (MOBIC) 7.5 MG tablet TAKE 1 TABLET BY MOUTH ONCE DAILY AS NEEDED PAIN  30 tablet  3    Allergies  Allergen Reactions  . Ciprofloxacin   . Diltiazem Hcl     Family History  Problem Relation Age of Onset  . Diabetes Sister   . Coronary artery disease Neg Hx   . Heart attack Neg Hx   . Cancer Sister     breast- recovered    BP 148/90  Pulse 104  Temp(Src) 97.8 F (36.6 C) (Oral)  Ht 5\' 2"  (1.575 m)  Wt 186 lb (84.369 kg)  BMI 34.02 kg/m2  SpO2 93%  Review of Systems Denies fever and     Objective:   Physical Exam VITAL SIGNS:  See vs page GENERAL: no distress head: no deformity eyes: no periorbital swelling, no proptosis external nose and ears are normal mouth: no lesion seen Both eac's and tm's are normal LUNGS:  Clear to auscultation.   (CXR: NAD)    Assessment & Plan:  acute bronchitis, new HTN, prob exac by coughing

## 2012-01-30 ENCOUNTER — Telehealth: Payer: Self-pay | Admitting: *Deleted

## 2012-01-30 NOTE — Telephone Encounter (Signed)
Called pt to inform of chest xray results, pt not available, left message for pt to callback office. (Letter also mailed to pt)

## 2012-01-31 ENCOUNTER — Telehealth: Payer: Self-pay | Admitting: *Deleted

## 2012-01-31 NOTE — Telephone Encounter (Signed)
Pt informed of chest xray results  

## 2012-01-31 NOTE — Telephone Encounter (Signed)
Pt advised by Dava Najjar already

## 2012-01-31 NOTE — Telephone Encounter (Signed)
Request for Chest Xray results.

## 2012-01-31 NOTE — Telephone Encounter (Signed)
No acute changes on x-ray

## 2012-02-13 ENCOUNTER — Other Ambulatory Visit: Payer: Self-pay | Admitting: Internal Medicine

## 2012-03-05 ENCOUNTER — Ambulatory Visit (INDEPENDENT_AMBULATORY_CARE_PROVIDER_SITE_OTHER): Payer: Medicare Other | Admitting: Internal Medicine

## 2012-03-05 ENCOUNTER — Encounter: Payer: Self-pay | Admitting: Internal Medicine

## 2012-03-05 VITALS — BP 152/78 | HR 82 | Temp 97.8°F | Ht 62.0 in | Wt 193.0 lb

## 2012-03-05 DIAGNOSIS — R42 Dizziness and giddiness: Secondary | ICD-10-CM

## 2012-03-05 DIAGNOSIS — H6691 Otitis media, unspecified, right ear: Secondary | ICD-10-CM | POA: Insufficient documentation

## 2012-03-05 DIAGNOSIS — H6690 Otitis media, unspecified, unspecified ear: Secondary | ICD-10-CM

## 2012-03-05 DIAGNOSIS — J309 Allergic rhinitis, unspecified: Secondary | ICD-10-CM | POA: Insufficient documentation

## 2012-03-05 MED ORDER — PREDNISONE 10 MG PO TABS: ORAL_TABLET | ORAL | Status: DC

## 2012-03-05 MED ORDER — AZITHROMYCIN 250 MG PO TABS: ORAL_TABLET | ORAL | Status: AC

## 2012-03-05 MED ORDER — MECLIZINE HCL 25 MG PO TABS: ORAL_TABLET | ORAL | Status: DC

## 2012-03-05 NOTE — Patient Instructions (Signed)
Take all new medications as prescribed - the antibiotic, and prednisone Continue all other medications as before, including the meclizine for vertigo You can also take Mucinex (or it's generic off brand) for congestion and ear fullness Please have the pharmacy call with any other refills you may need.

## 2012-03-05 NOTE — Assessment & Plan Note (Signed)
Mild to mod, for antibx course,  to f/u any worsening symptoms or concerns 

## 2012-03-05 NOTE — Assessment & Plan Note (Signed)
Mild, for low dose prednisone x 5 days,  to f/u any worsening symptoms or concerns

## 2012-03-05 NOTE — Assessment & Plan Note (Signed)
For meclizine re-start, f/u prn

## 2012-03-05 NOTE — Progress Notes (Signed)
Subjective:    Patient ID: [name], female    DOB: 1926-01-29, 78 y.o.   MRN: [id number]  HPI  Here to f/u after tx for cold symtpoms several wks ago here, and overall improved except for worsening right ear discomfort, pain radiating to the right lateral neck, HA, and vertigo recurrent, worse in the last several days. Does also have several wks mild ongoing nasal allergy symptoms.   Has hx of BPV type symptoms beginning about 6 yrs ago, but excercises not helping too well with this episode.  No ST, and Pt denies chest pain, increased sob or doe, wheezing, orthopnea, PND, increased LE swelling, palpitations, dizziness or syncope.  Pt denies new neurological symptoms such as new or facial or extremity weakness or numbness.   Pt denies polydipsia, polyuria.  Pt states overall good compliance with meds, on mult meds.  Has been feeling warm, but no high fever, chills or hearing loss. Past Medical History  Diagnosis Date  . Dyspepsia and other specified disorders of function of stomach   . Chronic kidney disease, stage I   . Heat stroke and sunstroke   . Other and unspecified hyperlipidemia   . Gout, unspecified   . Type II or unspecified type diabetes mellitus without mention of complication, not stated as uncontrolled   . Unspecified essential hypertension   . Hemorrhoid    Past Surgical History  Procedure Date  . Colon surgery   . Tonsillectomy     reports that she has never smoked. She has never used smokeless tobacco. She reports that she does not drink alcohol or use illicit drugs. family history includes Cancer in her sister and Diabetes in her sister.  There is no history of Coronary artery disease and Heart attack. Allergies  Allergen Reactions  . Ciprofloxacin   . Diltiazem Hcl    Current Outpatient Prescriptions on File Prior to Visit  Medication Sig Dispense Refill  . allopurinol (ZYLOPRIM) 100 MG tablet Take 100 mg by mouth daily.        Marland Kitchen ALPRAZolam (XANAX) 0.25 MG  tablet Take 1 tablet (0.25 mg total) by mouth as directed. 1/4 daily prn  30 tablet  5  . amLODipine (NORVASC) 10 MG tablet Take 1 tablet (10 mg total) by mouth daily.  90 tablet  1  . atorvastatin (LIPITOR) 20 MG tablet Take 20 mg by mouth daily.      . bumetanide (BUMEX) 2 MG tablet TAKE 1 TABLET BY MOUTH EVERY DAY  30 tablet  1  . cloNIDine (CATAPRES) 0.1 MG tablet Take 1 tablet (0.1 mg total) by mouth 2 (two) times daily. FOR BLOOD PRESSURE.  60 tablet  5  . diclofenac (VOLTAREN) 50 MG EC tablet TAKE 1 TABLET BY MOUTH TWICE A DAY  180 tablet  0  . glimepiride (AMARYL) 4 MG tablet TAKE 1 TABLET BY MOUTH IN THE MORNING AND 1 EVERY EVENING  60 tablet  5  . indomethacin (INDOCIN) 25 MG capsule Take 25 mg by mouth 3 (three) times daily with meals.        Marland Kitchen losartan (COZAAR) 100 MG tablet TAKE 1 TABLET BY MOUTH EVERY DAY  90 tablet  1  . meloxicam (MOBIC) 7.5 MG tablet TAKE 1 TABLET BY MOUTH ONCE DAILY AS NEEDED PAIN  30 tablet  3  . MULTIPLE VITAMIN PO Take 1 tablet by mouth daily.        Marland Kitchen omeprazole (PRILOSEC) 20 MG capsule TAKE 2 CAPSULES BY MOUTH EVERY MORNING  180 capsule  3  . promethazine (PHENERGAN) 12.5 MG tablet TAKE 1 TABLET BY MOUTH EVERY 6 HOURS AS NEEDED NAUSEA  20 tablet  1  . vitamin C (ASCORBIC ACID) 500 MG tablet Take 500 mg by mouth daily.        . hydrocortisone (ANUSOL-HC) 25 MG suppository Place 1 suppository (25 mg total) rectally every 12 (twelve) hours.  12 suppository  1  . promethazine-codeine (PHENERGAN WITH CODEINE) 6.25-10 MG/5ML syrup Take 5 mLs by mouth every 6 (six) hours as needed.  180 mL  1  . DISCONTD: simvastatin (ZOCOR) 40 MG tablet Take 20 mg by mouth at bedtime.         Review of Systems Review of Systems  Constitutional: Negative for diaphoresis and unexpected weight change.  Eyes: Negative for photophobia and visual disturbance.  Respiratory: Negative for choking and stridor.   Gastrointestinal: Negative for vomiting and blood in stool.    Genitourinary: Negative for hematuria and decreased urine volume.  Musculoskeletal: Negative for gait problem.     Objective:   Physical Exam BP 152/78  Pulse 82  Temp(Src) 97.8 F (36.6 C) (Oral)  Ht 5\' 2"  (1.575 m)  Wt 193 lb (87.544 kg)  BMI 35.30 kg/m2  SpO2 97% Physical Exam  VS noted Constitutional: Pt appears well-developed and well-nourished.  HENT: Head: Normocephalic.  Right Ear: External ear normal.  Left Ear: External ear normal.  Right > left  tm's severe erythema.  Sinus nontender.  Pharynx mild erythema Eyes: Conjunctivae and EOM are normal. Pupils are equal, round, and reactive to light.  Neck: Normal range of motion. Neck supple.  Cardiovascular: Normal rate and regular rhythm.   Pulmonary/Chest: Effort normal and breath sounds normal.  Neurological: Pt is alert. No cranial nerve deficit. Motor/gait intact  Skin: Skin is warm. No erythema. No rash    Assessment & Plan:

## 2012-03-10 ENCOUNTER — Other Ambulatory Visit: Payer: Self-pay | Admitting: Internal Medicine

## 2012-03-12 ENCOUNTER — Other Ambulatory Visit: Payer: Self-pay | Admitting: Internal Medicine

## 2012-03-25 ENCOUNTER — Telehealth: Payer: Self-pay | Admitting: *Deleted

## 2012-03-25 NOTE — Telephone Encounter (Signed)
Patient request her records be sent to the new ENT doctor she is to see. Explained to patient would need to sign medical release form in medical records for this to be done.

## 2012-04-30 ENCOUNTER — Emergency Department (HOSPITAL_COMMUNITY): Payer: Medicare Other

## 2012-04-30 ENCOUNTER — Encounter (HOSPITAL_COMMUNITY): Payer: Self-pay | Admitting: *Deleted

## 2012-04-30 ENCOUNTER — Observation Stay (HOSPITAL_COMMUNITY)
Admission: EM | Admit: 2012-04-30 | Discharge: 2012-05-01 | Disposition: A | Discharge status: Home / Self Care | Payer: Medicare Other | Attending: Internal Medicine | Admitting: Internal Medicine

## 2012-04-30 DIAGNOSIS — R739 Hyperglycemia, unspecified: Secondary | ICD-10-CM

## 2012-04-30 DIAGNOSIS — M109 Gout, unspecified: Secondary | ICD-10-CM | POA: Insufficient documentation

## 2012-04-30 DIAGNOSIS — I10 Essential (primary) hypertension: Secondary | ICD-10-CM

## 2012-04-30 DIAGNOSIS — E119 Type 2 diabetes mellitus without complications: Secondary | ICD-10-CM | POA: Insufficient documentation

## 2012-04-30 DIAGNOSIS — R42 Dizziness and giddiness: Secondary | ICD-10-CM

## 2012-04-30 DIAGNOSIS — I129 Hypertensive chronic kidney disease with stage 1 through stage 4 chronic kidney disease, or unspecified chronic kidney disease: Secondary | ICD-10-CM | POA: Insufficient documentation

## 2012-04-30 DIAGNOSIS — D649 Anemia, unspecified: Secondary | ICD-10-CM | POA: Insufficient documentation

## 2012-04-30 DIAGNOSIS — E871 Hypo-osmolality and hyponatremia: Secondary | ICD-10-CM

## 2012-04-30 DIAGNOSIS — E1169 Type 2 diabetes mellitus with other specified complication: Secondary | ICD-10-CM | POA: Diagnosis present

## 2012-04-30 DIAGNOSIS — E1165 Type 2 diabetes mellitus with hyperglycemia: Secondary | ICD-10-CM

## 2012-04-30 DIAGNOSIS — N181 Chronic kidney disease, stage 1: Secondary | ICD-10-CM | POA: Insufficient documentation

## 2012-04-30 DIAGNOSIS — Z79899 Other long term (current) drug therapy: Secondary | ICD-10-CM | POA: Insufficient documentation

## 2012-04-30 LAB — URINALYSIS, ROUTINE W REFLEX MICROSCOPIC
Bilirubin Urine: NEGATIVE
Glucose, UA: 100 mg/dL — AB
Hgb urine dipstick: NEGATIVE
Ketones, ur: NEGATIVE mg/dL
Leukocytes, UA: NEGATIVE
Nitrite: NEGATIVE
Protein, ur: 100 mg/dL — AB
Specific Gravity, Urine: 1.011 (ref 1.005–1.030)
Urobilinogen, UA: 0.2 mg/dL (ref 0.0–1.0)
pH: 7 (ref 5.0–8.0)

## 2012-04-30 LAB — COMPREHENSIVE METABOLIC PANEL
BUN: 20 mg/dL (ref 6–23)
CO2: 25 mEq/L (ref 19–32)
Chloride: 85 mEq/L — ABNORMAL LOW (ref 96–112)
Creatinine, Ser: 1.2 mg/dL — ABNORMAL HIGH (ref 0.50–1.10)
GFR calc Af Amer: 47 mL/min — ABNORMAL LOW (ref >90)
GFR calc non Af Amer: 41 mL/min — ABNORMAL LOW (ref >90)
Total Bilirubin: 0.2 mg/dL — ABNORMAL LOW (ref 0.3–1.2)

## 2012-04-30 LAB — CARDIAC PANEL(CRET KIN+CKTOT+MB+TROPI)
CK, MB: 5.7 ng/mL — ABNORMAL HIGH (ref 0.3–4.0)
Total CK: 257 U/L — ABNORMAL HIGH (ref 7–177)
Troponin I: <0.3 ng/mL (ref <0.30)

## 2012-04-30 LAB — DIFFERENTIAL
Basophils Relative: 0 % (ref 0–1)
Monocytes Absolute: 0.5 10*3/uL (ref 0.1–1.0)
Monocytes Relative: 7 % (ref 3–12)
Neutro Abs: 4.9 10*3/uL (ref 1.7–7.7)

## 2012-04-30 LAB — GLUCOSE, CAPILLARY: Glucose-Capillary: 185 mg/dL — ABNORMAL HIGH (ref 70–99)

## 2012-04-30 LAB — CBC
HCT: 32.4 % — ABNORMAL LOW (ref 36.0–46.0)
Hemoglobin: 11.5 g/dL — ABNORMAL LOW (ref 12.0–15.0)
MCHC: 35.5 g/dL (ref 30.0–36.0)

## 2012-04-30 LAB — BASIC METABOLIC PANEL
BUN: 17 mg/dL (ref 6–23)
CO2: 24 mEq/L (ref 19–32)
Chloride: 88 mEq/L — ABNORMAL LOW (ref 96–112)
Creatinine, Ser: 1.12 mg/dL — ABNORMAL HIGH (ref 0.50–1.10)
GFR calc Af Amer: 51 mL/min — ABNORMAL LOW (ref >90)
Potassium: 4.7 mEq/L (ref 3.5–5.1)

## 2012-04-30 MED ORDER — BUMETANIDE 2 MG PO TABS
2.0000 mg | ORAL_TABLET | Freq: Every day | ORAL | Status: DC
  Administered 2012-05-01: 2 mg via ORAL
  Filled 2012-04-30: qty 1

## 2012-04-30 MED ORDER — DOCUSATE SODIUM 100 MG PO CAPS
100.0000 mg | ORAL_CAPSULE | Freq: Two times a day (BID) | ORAL | Status: DC
  Administered 2012-05-01 (×2): 100 mg via ORAL
  Filled 2012-04-30 (×3): qty 1

## 2012-04-30 MED ORDER — ATORVASTATIN CALCIUM 20 MG PO TABS
20.0000 mg | ORAL_TABLET | Freq: Every day | ORAL | Status: DC
  Filled 2012-04-30: qty 1

## 2012-04-30 MED ORDER — SODIUM CHLORIDE 0.9 % IV SOLN: 250.0000 mL | INTRAVENOUS | Status: DC | PRN

## 2012-04-30 MED ORDER — ONDANSETRON HCL 4 MG PO TABS: 4.0000 mg | ORAL_TABLET | Freq: Four times a day (QID) | ORAL | Status: DC | PRN

## 2012-04-30 MED ORDER — CLONIDINE HCL 0.1 MG PO TABS
0.1000 mg | ORAL_TABLET | Freq: Two times a day (BID) | ORAL | Status: DC
  Administered 2012-05-01 (×2): 0.1 mg via ORAL
  Filled 2012-04-30 (×3): qty 1

## 2012-04-30 MED ORDER — GLIMEPIRIDE 4 MG PO TABS
4.0000 mg | ORAL_TABLET | Freq: Two times a day (BID) | ORAL | Status: DC
  Administered 2012-05-01: 4 mg via ORAL
  Filled 2012-04-30 (×3): qty 1

## 2012-04-30 MED ORDER — HYDROCODONE-ACETAMINOPHEN 5-325 MG PO TABS: 1.0000 | ORAL_TABLET | ORAL | Status: DC | PRN

## 2012-04-30 MED ORDER — ONDANSETRON HCL 4 MG/2ML IJ SOLN: 4.0000 mg | Freq: Four times a day (QID) | INTRAMUSCULAR | Status: DC | PRN

## 2012-04-30 MED ORDER — SODIUM CHLORIDE 1 G PO TABS
1.0000 g | ORAL_TABLET | Freq: Three times a day (TID) | ORAL | Status: DC
  Administered 2012-05-01 (×2): 1 g via ORAL
  Filled 2012-04-30 (×4): qty 1

## 2012-04-30 MED ORDER — AMLODIPINE BESYLATE 10 MG PO TABS
10.0000 mg | ORAL_TABLET | Freq: Every day | ORAL | Status: DC
  Administered 2012-05-01: 10 mg via ORAL
  Filled 2012-04-30: qty 1

## 2012-04-30 MED ORDER — PANTOPRAZOLE SODIUM 40 MG PO TBEC
40.0000 mg | DELAYED_RELEASE_TABLET | Freq: Every day | ORAL | Status: DC
  Administered 2012-05-01 (×2): 40 mg via ORAL
  Filled 2012-04-30 (×3): qty 1

## 2012-04-30 MED ORDER — SODIUM CHLORIDE 0.9 % IJ SOLN: 3.0000 mL | INTRAMUSCULAR | Status: DC | PRN

## 2012-04-30 MED ORDER — LOSARTAN POTASSIUM 50 MG PO TABS
100.0000 mg | ORAL_TABLET | Freq: Every day | ORAL | Status: DC
  Administered 2012-05-01: 100 mg via ORAL
  Filled 2012-04-30: qty 2

## 2012-04-30 MED ORDER — INSULIN ASPART 100 UNIT/ML ~~LOC~~ SOLN
0.0000 [IU] | Freq: Three times a day (TID) | SUBCUTANEOUS | Status: DC
  Administered 2012-05-01 (×2): 3 [IU] via SUBCUTANEOUS

## 2012-04-30 MED ORDER — SODIUM CHLORIDE 0.9 % IJ SOLN
3.0000 mL | Freq: Two times a day (BID) | INTRAMUSCULAR | Status: DC
  Administered 2012-05-01 (×2): 3 mL via INTRAVENOUS

## 2012-04-30 MED ORDER — SODIUM CHLORIDE 0.9 % IJ SOLN: 3.0000 mL | Freq: Two times a day (BID) | INTRAMUSCULAR | Status: DC

## 2012-04-30 MED ORDER — ACETAMINOPHEN 325 MG PO TABS: 650.0000 mg | ORAL_TABLET | Freq: Four times a day (QID) | ORAL | Status: DC | PRN

## 2012-04-30 MED ORDER — ACETAMINOPHEN 650 MG RE SUPP: 650.0000 mg | Freq: Four times a day (QID) | RECTAL | Status: DC | PRN

## 2012-04-30 MED ORDER — SODIUM CHLORIDE 0.9 % IV BOLUS (SEPSIS)
1000.0000 mL | Freq: Once | INTRAVENOUS | Status: AC
  Administered 2012-04-30: 1000 mL via INTRAVENOUS

## 2012-04-30 MED ORDER — ALBUTEROL SULFATE (5 MG/ML) 0.5% IN NEBU: 2.5000 mg | INHALATION_SOLUTION | RESPIRATORY_TRACT | Status: DC | PRN

## 2012-04-30 MED ORDER — GUAIFENESIN-DM 100-10 MG/5ML PO SYRP: 5.0000 mL | ORAL_SOLUTION | ORAL | Status: DC | PRN

## 2012-04-30 MED ORDER — ALPRAZOLAM 0.25 MG PO TABS
0.2500 mg | ORAL_TABLET | Freq: Every day | ORAL | Status: DC
  Administered 2012-05-01: 0.25 mg via ORAL
  Filled 2012-04-30: qty 1

## 2012-04-30 MED ORDER — INSULIN ASPART 100 UNIT/ML ~~LOC~~ SOLN: 0.0000 [IU] | Freq: Every day | SUBCUTANEOUS | Status: DC

## 2012-04-30 MED ORDER — ALLOPURINOL 100 MG PO TABS
100.0000 mg | ORAL_TABLET | Freq: Every day | ORAL | Status: DC
  Administered 2012-05-01: 100 mg via ORAL
  Filled 2012-04-30: qty 1

## 2012-04-30 MED ORDER — ALUM & MAG HYDROXIDE-SIMETH 200-200-20 MG/5ML PO SUSP: 30.0000 mL | Freq: Four times a day (QID) | ORAL | Status: DC | PRN

## 2012-04-30 NOTE — ED Notes (Signed)
Patient reports hx of hyperglycemia. States she checks CBG q daily, reports sugar was in 300's and became concerned. Reports slight headache and vertigo. Pt states she had right ear infection 2 months ago, has not followed up with ENT, believes infection may be r/t vertigo

## 2012-04-30 NOTE — ED Provider Notes (Addendum)
History     CSN: [id number]  Arrival date & time 04/30/12  1700   First MD Initiated Contact with Patient 04/30/12 1745      Chief Complaint  Patient presents with  . Hyperglycemia    (Consider location/radiation/quality/duration/timing/severity/associated sxs/prior treatment) HPI Comments: Patient states she's not felt right in her head all day long. She describes it as a dizzy sensation and fullness in her head.  Is following her vertiginous symptoms. No localized weakness. No pain. She states today her blood sugar has continued to elevate despite no change in her diet or medications. The last time she checked checked it SMS 300 and she states she was feeling so bad she decided to come in. On arrival here patient's blood sugar was only 230 and then improved to 180 without intervention. She denies any urinary symptoms, chest pain or shortness of breath.  The history is provided by the patient and a relative.    Past Medical History  Diagnosis Date  . Dyspepsia and other specified disorders of function of stomach   . Chronic kidney disease, stage I   . Heat stroke and sunstroke   . Other and unspecified hyperlipidemia   . Gout, unspecified   . Type II or unspecified type diabetes mellitus without mention of complication, not stated as uncontrolled   . Unspecified essential hypertension   . Hemorrhoid     Past Surgical History  Procedure Date  . Colon surgery   . Tonsillectomy     Family History  Problem Relation Age of Onset  . Diabetes Sister   . Coronary artery disease Neg Hx   . Heart attack Neg Hx   . Cancer Sister     breast- recovered    History  Substance Use Topics  . Smoking status: Never Smoker   . Smokeless tobacco: Never Used  . Alcohol Use: No    OB History    Grav Para Term Preterm Abortions TAB SAB Ect Mult Living                  Review of Systems  Constitutional: Negative for fever, appetite change and fatigue.  Respiratory: Negative for  cough and shortness of breath.   Cardiovascular: Negative for chest pain.  Gastrointestinal: Negative for nausea, vomiting and diarrhea.  Neurological: Positive for dizziness and light-headedness. Negative for syncope, speech difficulty and weakness.  All other systems reviewed and are negative.    Allergies  Ciprofloxacin and Diltiazem hcl  Home Medications   Current Outpatient Rx  Name Route Sig Dispense Refill  . ALLOPURINOL 100 MG PO TABS Oral Take 100 mg by mouth daily.      Marland Kitchen ALPRAZOLAM 0.25 MG PO TABS Oral Take 1 tablet (0.25 mg total) by mouth as directed. 1/4 daily prn 30 tablet 5  . AMLODIPINE BESYLATE 10 MG PO TABS Oral Take 1 tablet (10 mg total) by mouth daily. 90 tablet 1  . ATORVASTATIN CALCIUM 20 MG PO TABS Oral Take 20 mg by mouth daily.    . BUMETANIDE 2 MG PO TABS  TAKE 1 TABLET BY MOUTH EVERY DAY 30 tablet 1  . CLONIDINE HCL 0.1 MG PO TABS Oral Take 1 tablet (0.1 mg total) by mouth 2 (two) times daily. FOR BLOOD PRESSURE. 60 tablet 5  . DICLOFENAC SODIUM 50 MG PO TBEC  TAKE 1 TABLET BY MOUTH TWICE A DAY 180 tablet 0  . GLIMEPIRIDE 4 MG PO TABS  TAKE 1 TABLET BY MOUTH IN THE MORNING  AND 1 EVERY EVENING 60 tablet 5  . HYDROCORTISONE ACETATE 25 MG RE SUPP  INSERT SUPPOSITORY RECTALLY TWO TIMES A DAY AS NEEDED 12 suppository 1  . INDOMETHACIN 25 MG PO CAPS Oral Take 25 mg by mouth 3 (three) times daily with meals.      Marland Kitchen LOSARTAN POTASSIUM 100 MG PO TABS  TAKE 1 TABLET BY MOUTH EVERY DAY 90 tablet 1  . MECLIZINE HCL 25 MG PO TABS  1/2 - 1 tab by mouth three times per day as needed for vertigo 30 tablet 3  . MELOXICAM 7.5 MG PO TABS  TAKE 1 TABLET BY MOUTH ONCE DAILY AS NEEDED PAIN 30 tablet 3  . OMEPRAZOLE 20 MG PO CPDR  TAKE 2 CAPSULES BY MOUTH EVERY MORNING 180 capsule 3  . PROMETHAZINE HCL 12.5 MG PO TABS  TAKE 1 TABLET BY MOUTH EVERY 6 HOURS AS NEEDED NAUSEA 20 tablet 1  . VITAMIN C 500 MG PO TABS Oral Take 500 mg by mouth daily.        BP 182/94  Pulse 86  Temp  98.6 F (37 C) (Oral)  Resp 16  SpO2 99%  Physical Exam  Nursing note and vitals reviewed. Constitutional: She is oriented to person, place, and time. She appears well-developed and well-nourished. No distress.  HENT:  Head: Normocephalic and atraumatic.  Right Ear: Tympanic membrane and ear canal normal.  Left Ear: Tympanic membrane and ear canal normal.  Mouth/Throat: Oropharynx is clear and moist.  Eyes: Conjunctivae and EOM are normal. Pupils are equal, round, and reactive to light.  Neck: Normal range of motion. Neck supple.  Cardiovascular: Normal rate, regular rhythm and intact distal pulses.   No murmur heard. Pulmonary/Chest: Effort normal and breath sounds normal. No respiratory distress. She has no wheezes. She has no rales.  Abdominal: Soft. She exhibits no distension. There is no tenderness. There is no rebound and no guarding.  Musculoskeletal: Normal range of motion. She exhibits edema. She exhibits no tenderness.       1+ edema bilateral ankles  Neurological: She is alert and oriented to person, place, and time.  Skin: Skin is warm and dry. No rash noted. No erythema.  Psychiatric: She has a normal mood and affect. Her behavior is normal.    ED Course  Procedures (including critical care time)  Labs Reviewed  GLUCOSE, CAPILLARY - Abnormal; Notable for the following:    Glucose-Capillary 233 (*)     All other components within normal limits  CBC - Abnormal; Notable for the following:    RBC 3.79 (*)     Hemoglobin 11.5 (*)     HCT 32.4 (*)     All other components within normal limits  COMPREHENSIVE METABOLIC PANEL - Abnormal; Notable for the following:    Sodium 121 (*)     Chloride 85 (*)     Glucose, Bld 213 (*)     Creatinine, Ser 1.20 (*)     Alkaline Phosphatase 153 (*)     Total Bilirubin 0.2 (*)     GFR calc non Af Amer 41 (*)     GFR calc Af Amer 47 (*)     All other components within normal limits  URINALYSIS, ROUTINE W REFLEX MICROSCOPIC -  Abnormal; Notable for the following:    Glucose, UA 100 (*)     Protein, ur 100 (*)     All other components within normal limits  GLUCOSE, CAPILLARY - Abnormal; Notable for the following:  Glucose-Capillary 185 (*)     All other components within normal limits  URINE MICROSCOPIC-ADD ON - Abnormal; Notable for the following:    Bacteria, UA FEW (*)     All other components within normal limits  DIFFERENTIAL  OSMOLALITY, URINE  SODIUM, URINE, RANDOM  CORTISOL-AM, BLOOD   Ct Head Wo Contrast  04/30/2012  *RADIOLOGY REPORT*  Clinical Data: 78 year old female with abnormal sensation.  CT HEAD WITHOUT CONTRAST  Technique:  Contiguous axial images were obtained from the base of the skull through the vertex without contrast.  Comparison: None.  Findings: Visualized paranasal sinuses and mastoids are clear. Hyperostosis of the calvarium. No acute osseous abnormality identified.  Visualized orbit soft tissues are within normal limits.  Visualized scalp soft tissues are within normal limits.  Mild Calcified atherosclerosis at the skull base.  Cerebral volume is within normal limits for age.  No midline shift, ventriculomegaly, mass effect, evidence of mass lesion, intracranial hemorrhage or evidence of cortically based acute infarction.  Gray-white matter differentiation is within normal limits throughout the brain.  No suspicious intracranial vascular hyperdensity.  IMPRESSION: Normal noncontrast CT appearance of the brain for age.  Original Report Authenticated By: Harley Hallmark, M.D.    Date: 04/30/2012  Rate: 73  Rhythm: normal sinus rhythm  QRS Axis: normal  Intervals: normal  ST/T Wave abnormalities: nonspecific ST changes  Conduction Disutrbances:first-degree A-V block   Narrative Interpretation:   Old EKG Reviewed: none available    1. Hyponatremia       MDM   Patient states today she is felt dizzy and like there is something wrong with her head. She also states her blood sugar  has been running unusually high at 300 today despite no change in her diet. She did take additional hypoglycemic medication to try to combat this. On arrival here her blood sugar was 233 and an hour later it was 180 and she is starting to diaphoretic with signs of hypoglycemia. Patient was given crackers and juice and her symptoms resolved. She denies any chest pain or difficulty with gait. She states she's been treated 3 months ago for an ear infection and fluid in her ears but she still waiting to see the specialist. She has no focal findings on exam today her head CT is negative however BMP shows hyponatremia of 120 which is most likely the cause of her symptoms. This may be medication related however patient also states she drinks a large amount of water. Will admit for treatment of hyponatremia to ensure her symptoms improve. She will need water restriction and she was given normal saline       Gwyneth Sprout, MD 04/30/12 2001  Gwyneth Sprout, MD 04/30/12 2009  Gwyneth Sprout, MD 04/30/12 2135

## 2012-04-30 NOTE — ED Notes (Signed)
Pt reports she became frightened after seeing high CBG level today. Pt is not currently experiencing hyperglycemic symptoms. States she lives alone and is able to independently preform ADLs including medication management and is able to control blood sugar levels

## 2012-04-30 NOTE — H&P (Signed)
PCP:   Illene Regulus, MD    Chief Complaint:   Funny feeling in the head  HPI: [name] is a 78 y.o. female   has a past medical history of Dyspepsia and other specified disorders of function of stomach; Chronic kidney disease, stage I; Heat stroke and sunstroke; Other and unspecified hyperlipidemia; Gout, unspecified; Type II or unspecified type diabetes mellitus without mention of complication, not stated as uncontrolled; Unspecified essential hypertension; and Hemorrhoid.   Presented with  For the past 24 hour she felt unwell. She checked her blood sugar and it was 300 and this scared her. She presented to ER. When she came to ED her SBP was up to 182. This has improved since. No chest pain no fever, no shortness of breath, no urinary complaints. She had bad cold 3 months ago resulting in ear infection and vertigo. She had a course of antibiotics and steroids.  She is going to follow up with ENT to see if this has resolved or not. Her sensation was described as having a "big head" and was somewhat similar to prior. In ER her Na was found to be 121 She has problems with hyponatremia in the past and was supposed to take sodium pill but does not. She does drink a lot of water and not enough solutes. Today she felt a bit lightheaded when she was sitting up. She denies sensation of thirst but does say she would like to eat something.  Review of Systems:    Pertinent positives include: lightheadedness  Constitutional:  No weight loss, night sweats, Fevers, chills, fatigue, weight loss  HEENT:  No headaches, Difficulty swallowing,Tooth/dental problems,Sore throat,  No sneezing, itching, ear ache, nasal congestion, post nasal drip,  Cardio-vascular:  No chest pain, Orthopnea, PND, anasarca, dizziness, palpitations.no Bilateral lower extremity swelling  GI:  No heartburn, indigestion, abdominal pain, nausea, vomiting, diarrhea, change in bowel habits, loss of appetite, melena, blood in  stool, hematemesis Resp:  no shortness of breath at rest. No dyspnea on exertion, No excess mucus, no productive cough, No non-productive cough, No coughing up of blood.No change in color of mucus.No wheezing. Skin:  no rash or lesions. No jaundice GU:  no dysuria, change in color of urine, no urgency or frequency. No straining to urinate.  No flank pain.  Musculoskeletal:  No joint pain or no joint swelling. No decreased range of motion. No back pain.  Psych:  No change in mood or affect. No depression or anxiety. No memory loss.  Neuro: no localizing neurological complaints, no tingling, no weakness, no double vision, no gait abnormality, no slurred speech, no confusion  Otherwise ROS are negative except for above, 10 systems were reviewed  Past Medical History: Past Medical History  Diagnosis Date  . Dyspepsia and other specified disorders of function of stomach   . Chronic kidney disease, stage I   . Heat stroke and sunstroke     pt states she doesn't remember this -04/30/12  . Other and unspecified hyperlipidemia   . Gout, unspecified   . Type II or unspecified type diabetes mellitus without mention of complication, not stated as uncontrolled   . Unspecified essential hypertension   . Hemorrhoid    Past Surgical History  Procedure Date  . Colon surgery   . Tonsillectomy      Medications: Prior to Admission medications   Medication Sig Start Date End Date Taking? Authorizing Provider  allopurinol (ZYLOPRIM) 100 MG tablet Take 100 mg by mouth daily.  Yes Historical Provider, MD  ALPRAZolam (XANAX) 0.25 MG tablet Take 1 tablet (0.25 mg total) by mouth as directed. 1/4 daily prn 10/18/11  Yes Jacques Navy, MD  amLODipine (NORVASC) 10 MG tablet Take 1 tablet (10 mg total) by mouth daily. 11/15/11  Yes Jacques Navy, MD  atorvastatin (LIPITOR) 20 MG tablet Take 20 mg by mouth daily.   Yes Historical Provider, MD  bumetanide (BUMEX) 2 MG tablet TAKE 1 TABLET BY MOUTH  EVERY DAY 03/12/12  Yes Jacques Navy, MD  cloNIDine (CATAPRES) 0.1 MG tablet Take 1 tablet (0.1 mg total) by mouth 2 (two) times daily. FOR BLOOD PRESSURE. 11/15/11  Yes Jacques Navy, MD  diclofenac (VOLTAREN) 50 MG EC tablet TAKE 1 TABLET BY MOUTH TWICE A DAY 02/13/12  Yes Jacques Navy, MD  glimepiride (AMARYL) 4 MG tablet TAKE 1 TABLET BY MOUTH IN THE MORNING AND 1 EVERY EVENING 03/10/12  Yes Jacques Navy, MD  hydrocortisone (ANUSOL-HC) 25 MG suppository INSERT SUPPOSITORY RECTALLY TWO TIMES A DAY AS NEEDED 03/12/12  Yes Jacques Navy, MD  indomethacin (INDOCIN) 25 MG capsule Take 25 mg by mouth 3 (three) times daily with meals.     Yes Historical Provider, MD  losartan (COZAAR) 100 MG tablet TAKE 1 TABLET BY MOUTH EVERY DAY 03/12/12  Yes Jacques Navy, MD  meclizine (ANTIVERT) 25 MG tablet 1/2 - 1 tab by mouth three times per day as needed for vertigo 03/05/12  Yes Corwin Levins, MD  meloxicam (MOBIC) 7.5 MG tablet TAKE 1 TABLET BY MOUTH ONCE DAILY AS NEEDED PAIN 01/11/12  Yes Jacques Navy, MD  omeprazole (PRILOSEC) 20 MG capsule TAKE 2 CAPSULES BY MOUTH EVERY MORNING 07/25/11  Yes Jacques Navy, MD  promethazine (PHENERGAN) 12.5 MG tablet TAKE 1 TABLET BY MOUTH EVERY 6 HOURS AS NEEDED NAUSEA 02/13/12  Yes Jacques Navy, MD  vitamin C (ASCORBIC ACID) 500 MG tablet Take 500 mg by mouth daily.     Yes Historical Provider, MD    Allergies:   Allergies  Allergen Reactions  . Ciprofloxacin   . Diltiazem Hcl     Social History:  Ambulatory independently  Lives at home alone   reports that she has never smoked. She has never used smokeless tobacco. She reports that she does not drink alcohol or use illicit drugs.   Family History: family history includes Breast cancer in her sister; Cancer in her sister; Diabetes in her sister; Gout in her father; Heart disease in her mother; Hypertension in her son; and Stroke in her son.  There is no history of Coronary artery disease and  Heart attack.    Physical Exam: Patient Vitals for the past 24 hrs:  BP Temp Temp src Pulse Resp SpO2  04/30/12 2038 156/75 mmHg - - 78  - -  04/30/12 2035 158/76 mmHg - - 76  - -  04/30/12 2032 151/68 mmHg - - 75  - -  04/30/12 1944 154/79 mmHg - - 79  17  100 %  04/30/12 1715 182/94 mmHg 98.6 F (37 C) Oral 86  16  99 %    1. General:  in No Acute distress 2. Psychological: Alert and Oriented 3. Head/ENT:   Moist  Mucous Membranes                          Head Non traumatic, neck supple, no air fluid level detected in the  ears bilaterally                          Normal  Dentition 4. SKIN: normal Skin turgor,  Skin clean Dry and intact no rash 5. Heart: Regular rate and rhythm no Murmur, Rub or gallop 6. Lungs: Somewhat coarse breath sounds but no wheezes or crackles   7. Abdomen: Soft, non-tender, Non distended 8. Lower extremities: no clubbing, cyanosis, or edema 9. Neurologically Grossly intact, moving all 4 extremities equally 10. MSK: Normal range of motion  body mass index is unknown because there is no height or weight on file.   Labs on Admission:   [facility] 04/30/12 1837  NA 121*  K 4.1  CL 85*  CO2 25  GLUCOSE 213*  BUN 20  CREATININE 1.20*  CALCIUM 9.5  MG --  PHOS --    Basename 04/30/12 1837  AST 21  ALT 20  ALKPHOS 153*  BILITOT 0.2*  PROT 7.7  ALBUMIN 3.9   No results found for this basename: LIPASE:2,AMYLASE:2 in the last 72 hours  Basename 04/30/12 1837  WBC 7.1  NEUTROABS 4.9  HGB 11.5*  HCT 32.4*  MCV 85.5  PLT 300   No results found for this basename: CKTOTAL:3,CKMB:3,CKMBINDEX:3,TROPONINI:3 in the last 72 hours No results found for this basename: TSH,T4TOTAL,FREET3,T3FREE,THYROIDAB in the last 72 hours No results found for this basename: VITAMINB12:2,FOLATE:2,FERRITIN:2,TIBC:2,IRON:2,RETICCTPCT:2 in the last 72 hours Lab Results  Component Value Date   HGBA1C 7.9* 10/21/2009    The CrCl is unknown because both a height and  weight (above a minimum accepted value) are required for this calculation. ABG No results found for this basename: phart, pco2, po2, hco3, tco2, acidbasedef, o2sat     No results found for this basename: DDIMER     Other results:  I have pearsonaly reviewed this: ECG REPORT  Rate: 73  Rhythm: This degree AV block sinus rhythm ST&T Change: No ischemic changes  UA no evidence of infection specific gravity 1.011    Radiological Exams on Admission: Ct Head Wo Contrast  04/30/2012  *RADIOLOGY REPORT*  Clinical Data: 78 year old female with abnormal sensation.  CT HEAD WITHOUT CONTRAST  Technique:  Contiguous axial images were obtained from the base of the skull through the vertex without contrast.  Comparison: None.  Findings: Visualized paranasal sinuses and mastoids are clear. Hyperostosis of the calvarium. No acute osseous abnormality identified.  Visualized orbit soft tissues are within normal limits.  Visualized scalp soft tissues are within normal limits.  Mild Calcified atherosclerosis at the skull base.  Cerebral volume is within normal limits for age.  No midline shift, ventriculomegaly, mass effect, evidence of mass lesion, intracranial hemorrhage or evidence of cortically based acute infarction.  Gray-white matter differentiation is within normal limits throughout the brain.  No suspicious intracranial vascular hyperdensity.  IMPRESSION: Normal noncontrast CT appearance of the brain for age.  Original Report Authenticated By: Harley Hallmark, M.D.    Assessment/Plan  78 year old female with history of hyponatremia with sodium one year ago being 129 comes in feeling unwell with sodium down to 121 elevated blood sugars and hypertension  Present on Admission:  .Hyponatremia - patient has a history of this in the past for some reason she is no longer taking this her sodium pills orthostatics were done and she does not appear to be dehydrated, no offending medications was noted. This  could be potential SIADH versus reset osmostat. Will admit and observe overnight administer  sodium pills and fluid restriction. Monitor sodium obtain urine sodium and osmolarity. Check TSH and cortisol level also obtain chest x-ray.  Marland KitchenDIABETES MELLITUS, TYPE II - continue home medications and add sliding scale insulin check hemoglobin A1c  .HYPERTENSION - continue home medications and continue to monitor currently blood pressure improved last reading was 150s  .CHRONIC KIDNEY DISEASE STAGE I - currently creatinine 1.2 continue Cozaar   .Light headedness - etiology unclear this at this point no evidence of infection. This could be symptomatic hyponatremia. Cardiac etiology less likely but will monitor on telemetry and get 1 set of cardiac markers  .Hyperglycemia - this is improved continue monitor him cover with insulin  .Anemia  -  Order anemia panel and Hemoccult stool    Prophylaxis: SCD Protonix  CODE STATUS: FULL CODE  Other plan as per orders.  I have spent a total of 55 min on this admission  [name] 04/30/2012, 8:56 PM

## 2012-04-30 NOTE — ED Notes (Signed)
Pt c/o nausea, sweating, and not feeling well.  CBG 185.  Dr. Truitt Merle.  Pt given ginger ale and gram crackers.

## 2012-04-30 NOTE — ED Notes (Signed)
MD at bedside. Dr. Plunkett at bedside.  

## 2012-04-30 NOTE — ED Notes (Signed)
Patient aware of need for urine specimen. Patient unable to void at this time. Patient given urinal. Encouraged to call for assistance if needed.   

## 2012-04-30 NOTE — ED Notes (Signed)
Pt stated she was worried about her CBG.  C/o of headache and CBG of 300. States she thinks it might've been a change in her diet, or after she ate a hotdog her CBG increased.   HX of DM.

## 2012-05-01 ENCOUNTER — Observation Stay (HOSPITAL_COMMUNITY): Payer: Medicare Other

## 2012-05-01 DIAGNOSIS — E871 Hypo-osmolality and hyponatremia: Secondary | ICD-10-CM

## 2012-05-01 LAB — IRON AND TIBC
Saturation Ratios: 25 % (ref 20–55)
UIBC: 220 ug/dL (ref 125–400)

## 2012-05-01 LAB — COMPREHENSIVE METABOLIC PANEL
ALT: 19 U/L (ref 0–35)
AST: 19 U/L (ref 0–37)
Alkaline Phosphatase: 146 U/L — ABNORMAL HIGH (ref 39–117)
CO2: 25 mEq/L (ref 19–32)
Calcium: 9.8 mg/dL (ref 8.4–10.5)
GFR calc Af Amer: 50 mL/min — ABNORMAL LOW (ref >90)
GFR calc non Af Amer: 43 mL/min — ABNORMAL LOW (ref >90)
Glucose, Bld: 282 mg/dL — ABNORMAL HIGH (ref 70–99)
Potassium: 4.1 mEq/L (ref 3.5–5.1)
Sodium: 126 mEq/L — ABNORMAL LOW (ref 135–145)
Total Protein: 7.3 g/dL (ref 6.0–8.3)

## 2012-05-01 LAB — RETICULOCYTES
RBC.: 3.83 MIL/uL — ABNORMAL LOW (ref 3.87–5.11)
Retic Count, Absolute: 88.1 10*3/uL (ref 19.0–186.0)
Retic Ct Pct: 2.3 % (ref 0.4–3.1)

## 2012-05-01 LAB — CORTISOL-AM, BLOOD: Cortisol - AM: 17 ug/dL (ref 4.3–22.4)

## 2012-05-01 LAB — TSH: TSH: 1.422 u[IU]/mL (ref 0.350–4.500)

## 2012-05-01 LAB — CBC
HCT: 33.1 % — ABNORMAL LOW (ref 36.0–46.0)
Platelets: 309 10*3/uL (ref 150–400)
RDW: 13.3 % (ref 11.5–15.5)
WBC: 5.3 10*3/uL (ref 4.0–10.5)

## 2012-05-01 LAB — FERRITIN: Ferritin: 99 ng/mL (ref 10–291)

## 2012-05-01 LAB — FOLATE: Folate: 12.4 ng/mL

## 2012-05-01 MED ORDER — GLIMEPIRIDE 4 MG PO TABS
4.0000 mg | ORAL_TABLET | Freq: Two times a day (BID) | ORAL | Status: DC
  Administered 2012-05-01: 4 mg via ORAL
  Filled 2012-05-01 (×3): qty 1

## 2012-05-01 NOTE — Progress Notes (Signed)
Inpatient Diabetes Program Recommendations  AACE/ADA: New Consensus Statement on Inpatient Glycemic Control (2009)  Target Ranges:  Prepandial:   less than 140 mg/dL      Peak postprandial:   less than 180 mg/dL (1-2 hours)      Critically ill patients:  140 - 180 mg/dL   Reason for Visit: Hyperglycemia  Results for [name], [name] (MRN [id number]) as of 05/01/2012 10:51  Ref. Range 04/30/2012 18:37 04/30/2012 22:12 05/01/2012 04:10  Glucose Latest Range: 70-99 mg/dL 409 (H) 811 (H) 914 (H)  Results for [name], [name] (MRN [id number]) as of 05/01/2012 10:51  Ref. Range 05/01/2012 04:10  Sodium Latest Range: 135-145 mEq/L 126 (L)  Potassium Latest Range: 3.5-5.1 mEq/L 4.1  Chloride Latest Range: 96-112 mEq/L 90 (L)  CO2 Latest Range: 19-32 mEq/L 25  BUN Latest Range: 6-23 mg/dL 16  Creat Latest Range: 0.50-1.10 mg/dL 0.86 (H)  Calcium Latest Range: 8.4-10.5 mg/dL 9.8  GFR calc non Af Amer Latest Range: >90 mL/min 43 (L)  GFR calc Af Amer Latest Range: >90 mL/min 50 (L)  Glucose Latest Range: 70-99 mg/dL 578 (H)  Phosphorus Latest Range: 2.3-4.6 mg/dL 3.5  Magnesium Latest Range: 1.5-2.5 mg/dL 1.7  Alkaline Phosphatase Latest Range: 39-117 U/L 146 (H)  Albumin Latest Range: 3.5-5.2 g/dL 3.6  AST Latest Range: 0-37 U/L 19  ALT Latest Range: 0-35 U/L 19  Total Protein Latest Range: 6.0-8.3 g/dL 7.3  Total Bilirubin Latest Range: 0.3-1.2 mg/dL 0.2 (L)    Inpatient Diabetes Program Recommendations HgbA1C: Check HgbA1C to assess glycemic control prior to hospitalization  Note: Will follow.

## 2012-05-01 NOTE — Progress Notes (Signed)
05/01/12 0600 Patient c/o vertigo when ambulating to bathroom.Staff accompanied patient to bathroom and back to bed for safety reasons.

## 2012-05-01 NOTE — Progress Notes (Signed)
   CARE MANAGEMENT NOTE 05/01/2012  Patient:  [name], [name]   Account Number:  [id number]  Date Initiated:  05/01/2012  Documentation initiated by:  Jiles Crocker  Subjective/Objective Assessment:   ADMITTED TO OBSERVATION WITH HYPONATREMIA     Action/Plan:   PCP IS DR Debby Bud; LIVES ALONE AT HOME WITH SUPPORT FROM FAMILY   Anticipated DC Date:  05/02/2012   Anticipated DC Plan:  HOME/SELF CARE      DC Planning Services  CM consult             Status of service:  In process, will continue to follow Medicare Important Message given?  NA - LOS <3 / Initial given by admissions (If response is "NO", the following Medicare IM given date fields will be blank)    Per UR Regulation:  Reviewed for med. necessity/level of care/duration of stay  Comments:  05/01/2012- B Filbert Craze RN, BSN, MHA

## 2012-05-02 ENCOUNTER — Telehealth: Payer: Self-pay | Admitting: *Deleted

## 2012-05-02 NOTE — Discharge Summary (Signed)
[name], [name] NO.:  [id number]  MEDICAL RECORD NO.:  [id number]  LOCATION:  1428                         FACILITY:  [facility]  PHYSICIAN:  Rosalyn Gess. Jocilynn Grade, MD  DATE OF BIRTH:  07/20/1926  DATE OF ADMISSION:  04/30/2012 DATE OF DISCHARGE:  05/01/2012                              DISCHARGE SUMMARY   ADMITTING DIAGNOSES: 1. Hyponatremia. 2. Hyperglycemia.  DISCHARGE DIAGNOSES: 1. Hyponatremia. 2. Hyperglycemia.  CONSULTANTS:  None.  PROCEDURES:  The patient had a CT scan of the brain performed on the day of admission, read as a normal noncontrast CT with normal appearance of her brain for her age with no acute injury or changes.  The patient had a chest x-ray, 2 view, on the day of admission, which was read as showing stable cardiopulmonary appearance with no new focal or acute abnormalities identified.  HISTORY OF PRESENT ILLNESS:  [name] is an 78 year old woman looking younger than her stated chronologic age, who at home noticed that her blood sugar was elevated.  She had been feeling ill with sensation that her head was very full.  She denied any chest pain, shortness of breath, urinary complaints.  She has had some problem with ear infection and vertigo, has been seeing ear, nose, and throat specialist for this.  She has been treated with a course of antibiotics and steroids.  The patient also was followed by [name] for her diabetes.  The patient does have a problem with chronic hyponatremia.  In the past, she had been instructed to take a sodium tablet.  She has been out of this medication for quite some time.  She does have a large intake of free water.  At the time of admission, she was found to have a sodium of 121.  Please see the admission H and P, as well as previous epic notes for past medical history, family history, social history, and admission examination.  HOSPITAL COURSE:  The patient was admitted to a telemetry floor.   She was given IV normal saline.  She was continued on all her home medications.  On the morning of the 20th, day of discharge, sodium was up to 126.  The patient remained asymptomatic.  The patient's serum glucose was 282 and had been remaining elevated.  However, she is asymptomatic with this.  The patient does follow with Dr. Adrian Prince for Diabetology, and she does have an appointment to see him on May 16, 2012.  With the patient's sodium being adequately corrected with her having no neurologic symptoms, with her feeling well and taking a diet at this point, she is cleared and ready for discharge to home.  DISCHARGE PHYSICAL EXAMINATION:  VITAL SIGNS:  Temperature was 98, blood pressure was 159/88, pulse 68, respirations 16, oxygen saturation was 98%. GENERAL APPEARANCE:  This is a pleasant, elderly African American woman, in no acute distress. HEENT:  Normocephalic, atraumatic.  Conjunctivae and sclerae were clear. Pupils equal, round, and reactive. CHEST:  The patient is moving air well with no rales, wheezes, or rhonchi.  No increased work of breathing. CARDIOVASCULAR:  2+ radial pulse and quiet precordium with a regular rate and rhythm without  murmurs, rubs, or gallops. ABDOMEN:  Obese, soft with no guarding or rebound.  No tenderness was appreciated.  Genitalia exam deferred. EXTREMITIES:  Without clubbing, cyanosis, or edema. NEUROLOGIC:  The patient is awake, alert.  She is oriented to person, place, time, and context.  She moves all extremities freely.  Cranial nerves were intact.  With the patient's sodium being up to 126 and stable for being ready to go home, she is to be discharged.  DISPOSITION:  She will return to home.  She will continue all of her home medications.  We will not make changes in her diabetic regimen since she will be seeing her diabetologist on May 16, 2012.  The patient will be seen for primary care on an as needed basis.  FINAL LABORATORY:   From May 01, 2012; sodium 126, potassium 4.1, chloride 90, CO2 25, BUN 16, creatinine 1.14, glucose 282.  Liver functions were normal.  Alkaline phosphatase was 146.  The patient did have cardiac enzymes that were negative.  The patient did have an iron panel on May 01, 2012, which revealed an iron of 72, TIBC of 292, iron saturation of 25% which is normal.  Folate was 12.4, B12 was normal at 903, TSH was normal at 1.42.  The patient's last A1c in this system is October 21, 2009, and was 7.9%.     Rosalyn Gess Babara Buffalo, MD     MEN/MEDQ  D:  05/01/2012  T:  05/01/2012  Job:  [id number]  cc:   Jeannett Senior A. Evlyn Kanner, M.D. Fax: [phone]

## 2012-05-02 NOTE — Discharge Summary (Signed)
NAMEJENNELLE, PINKSTAFF NO.:  [id number]  MEDICAL RECORD NO.:  [id number]  LOCATION:  1428                         FACILITY:  [facility]  PHYSICIAN:  Rosalyn Gess. Farrell Broerman, MD  DATE OF BIRTH:  1926-03-18  DATE OF ADMISSION:  04/30/2012 DATE OF DISCHARGE:  05/01/2012                              DISCHARGE SUMMARY   ADDENDUM:  Please send a copy of discharge dictation to Dr. Adrian Prince.     Rosalyn Gess Shakerria Parran, MD     MEN/MEDQ  D:  05/01/2012  T:  05/01/2012  Job:  [id number]  cc:   Jeannett Senior A. Evlyn Kanner, M.D. Fax: [phone]

## 2012-05-02 NOTE — Telephone Encounter (Signed)
Pt would like refill of sodium pill sent to her pharmacy. She states that she was discharged from hospital yesterday where she had a sodium infusion and would like an rx for sodium pill to be sent to her pharmacy.

## 2012-05-02 NOTE — Telephone Encounter (Signed)
I didn't know what :"sodium" pill she was taking. Please call her pharmacy to let them help out. Ok for refill as needed.

## 2012-05-05 NOTE — Telephone Encounter (Signed)
Pharmacy does not have a prescription for a sodium tablet on file for pt. Called pt to inform her, no answer/number busy.

## 2012-05-08 NOTE — Telephone Encounter (Signed)
Spoke with patient daughter. States this was several years ago that Dr. Debby Bud had prescribed for her mom. Daughter called [facility] pharmacy and states they have no record of this medication.. Please advise.

## 2012-05-08 NOTE — Telephone Encounter (Signed)
I don't believe salt tablets are available. Plan - liberalized amount of salt in diet.

## 2012-05-09 ENCOUNTER — Telehealth: Payer: Self-pay | Admitting: *Deleted

## 2012-05-09 NOTE — Telephone Encounter (Signed)
Talked with patient explained to increase her diet in sodium. Drink plenty of liquids and foods that contain sodium. Patient understands now

## 2012-05-10 ENCOUNTER — Other Ambulatory Visit: Payer: Self-pay | Admitting: Internal Medicine

## 2012-05-14 ENCOUNTER — Other Ambulatory Visit: Payer: Self-pay | Admitting: Internal Medicine

## 2012-05-22 ENCOUNTER — Other Ambulatory Visit: Payer: Self-pay | Admitting: *Deleted

## 2012-05-22 NOTE — Telephone Encounter (Signed)
Request. . refill on alprazolam  . LOV 04/2011

## 2012-05-23 MED ORDER — ALPRAZOLAM 0.25 MG PO TABS: 0.2500 mg | ORAL_TABLET | ORAL | Status: DC

## 2012-05-23 NOTE — Telephone Encounter (Signed)
Ok x 5 

## 2012-05-23 NOTE — Telephone Encounter (Signed)
[facility]razalam [facility]. Patient notified. And aware to make appt. With Dr. Debby Bud

## 2012-06-08 ENCOUNTER — Other Ambulatory Visit: Payer: Self-pay | Admitting: Internal Medicine

## 2012-06-11 ENCOUNTER — Other Ambulatory Visit: Payer: Self-pay | Admitting: Internal Medicine

## 2012-06-11 ENCOUNTER — Telehealth: Payer: Self-pay | Admitting: *Deleted

## 2012-06-11 NOTE — Telephone Encounter (Signed)
Med refill called to [facility] pharmacy for clonidine

## 2012-06-11 NOTE — Telephone Encounter (Signed)
Patient request refill on clonidine. Last OV 04/2011

## 2012-07-08 ENCOUNTER — Other Ambulatory Visit: Payer: Self-pay | Admitting: Internal Medicine

## 2012-07-14 ENCOUNTER — Other Ambulatory Visit: Payer: Self-pay | Admitting: Internal Medicine

## 2012-08-17 ENCOUNTER — Other Ambulatory Visit: Payer: Self-pay | Admitting: Internal Medicine

## 2012-10-14 ENCOUNTER — Other Ambulatory Visit: Payer: Self-pay | Admitting: Internal Medicine

## 2012-11-13 ENCOUNTER — Other Ambulatory Visit: Payer: Self-pay | Admitting: Internal Medicine

## 2012-11-15 ENCOUNTER — Other Ambulatory Visit: Payer: Self-pay | Admitting: Internal Medicine

## 2012-11-17 ENCOUNTER — Other Ambulatory Visit: Payer: Self-pay | Admitting: *Deleted

## 2012-11-17 MED ORDER — PROMETHAZINE HCL 12.5 MG PO TABS: 12.5000 mg | ORAL_TABLET | Freq: Four times a day (QID) | ORAL | Status: DC | PRN

## 2012-11-27 ENCOUNTER — Other Ambulatory Visit: Payer: Self-pay | Admitting: *Deleted

## 2012-11-27 MED ORDER — ONDANSETRON HCL 4 MG PO TABS: 4.0000 mg | ORAL_TABLET | Freq: Four times a day (QID) | ORAL | Status: DC | PRN

## 2012-12-12 ENCOUNTER — Other Ambulatory Visit: Payer: Self-pay | Admitting: Internal Medicine

## 2012-12-14 ENCOUNTER — Other Ambulatory Visit: Payer: Self-pay | Admitting: Internal Medicine

## 2012-12-15 ENCOUNTER — Other Ambulatory Visit: Payer: Self-pay | Admitting: *Deleted

## 2012-12-15 MED ORDER — ALPRAZOLAM 0.25 MG PO TABS: 0.2500 mg | ORAL_TABLET | Freq: Every evening | ORAL | Status: DC | PRN

## 2012-12-15 MED ORDER — BUMETANIDE 2 MG PO TABS: 2.0000 mg | ORAL_TABLET | Freq: Every day | ORAL | Status: DC

## 2012-12-16 ENCOUNTER — Other Ambulatory Visit: Payer: Self-pay | Admitting: Internal Medicine

## 2013-01-03 ENCOUNTER — Other Ambulatory Visit: Payer: Self-pay | Admitting: Internal Medicine

## 2013-01-05 NOTE — Telephone Encounter (Signed)
Pt last seen in 2012. Please advise.

## 2013-01-11 ENCOUNTER — Other Ambulatory Visit: Payer: Self-pay | Admitting: Internal Medicine

## 2013-01-12 ENCOUNTER — Other Ambulatory Visit: Payer: Self-pay | Admitting: *Deleted

## 2013-01-12 ENCOUNTER — Other Ambulatory Visit: Payer: Self-pay | Admitting: Internal Medicine

## 2013-01-12 MED ORDER — LOSARTAN POTASSIUM 100 MG PO TABS: 100.0000 mg | ORAL_TABLET | Freq: Every day | ORAL | Status: DC

## 2013-02-10 ENCOUNTER — Other Ambulatory Visit: Payer: Self-pay | Admitting: Internal Medicine

## 2013-02-27 ENCOUNTER — Encounter (HOSPITAL_COMMUNITY): Payer: Self-pay | Admitting: Emergency Medicine

## 2013-02-27 ENCOUNTER — Emergency Department (HOSPITAL_COMMUNITY)
Admission: EM | Admit: 2013-02-27 | Discharge: 2013-02-27 | Disposition: A | Discharge status: Home / Self Care | Payer: Medicare Other | Attending: Emergency Medicine | Admitting: Emergency Medicine

## 2013-02-27 DIAGNOSIS — E871 Hypo-osmolality and hyponatremia: Secondary | ICD-10-CM | POA: Insufficient documentation

## 2013-02-27 DIAGNOSIS — R5381 Other malaise: Secondary | ICD-10-CM | POA: Insufficient documentation

## 2013-02-27 DIAGNOSIS — Z79899 Other long term (current) drug therapy: Secondary | ICD-10-CM | POA: Insufficient documentation

## 2013-02-27 DIAGNOSIS — E785 Hyperlipidemia, unspecified: Secondary | ICD-10-CM | POA: Insufficient documentation

## 2013-02-27 DIAGNOSIS — N181 Chronic kidney disease, stage 1: Secondary | ICD-10-CM | POA: Insufficient documentation

## 2013-02-27 DIAGNOSIS — I129 Hypertensive chronic kidney disease with stage 1 through stage 4 chronic kidney disease, or unspecified chronic kidney disease: Secondary | ICD-10-CM | POA: Insufficient documentation

## 2013-02-27 DIAGNOSIS — N289 Disorder of kidney and ureter, unspecified: Secondary | ICD-10-CM | POA: Insufficient documentation

## 2013-02-27 DIAGNOSIS — E119 Type 2 diabetes mellitus without complications: Secondary | ICD-10-CM | POA: Insufficient documentation

## 2013-02-27 DIAGNOSIS — Z8719 Personal history of other diseases of the digestive system: Secondary | ICD-10-CM | POA: Insufficient documentation

## 2013-02-27 DIAGNOSIS — M109 Gout, unspecified: Secondary | ICD-10-CM | POA: Insufficient documentation

## 2013-02-27 DIAGNOSIS — Z8679 Personal history of other diseases of the circulatory system: Secondary | ICD-10-CM | POA: Insufficient documentation

## 2013-02-27 DIAGNOSIS — R531 Weakness: Secondary | ICD-10-CM

## 2013-02-27 LAB — POCT I-STAT, CHEM 8
BUN: 26 mg/dL — ABNORMAL HIGH (ref 6–23)
Chloride: 96 mEq/L (ref 96–112)
Creatinine, Ser: 1.3 mg/dL — ABNORMAL HIGH (ref 0.50–1.10)
Sodium: 129 mEq/L — ABNORMAL LOW (ref 135–145)
TCO2: 25 mmol/L (ref 0–100)

## 2013-02-27 LAB — URINALYSIS, ROUTINE W REFLEX MICROSCOPIC
Glucose, UA: NEGATIVE mg/dL
Ketones, ur: NEGATIVE mg/dL
Specific Gravity, Urine: 1.01 (ref 1.005–1.030)

## 2013-02-27 LAB — CBC WITH DIFFERENTIAL/PLATELET
Basophils Absolute: 0 10*3/uL (ref 0.0–0.1)
Basophils Relative: 1 % (ref 0–1)
Eosinophils Absolute: 0.1 10*3/uL (ref 0.0–0.7)
MCH: 30.7 pg (ref 26.0–34.0)
MCHC: 35.2 g/dL (ref 30.0–36.0)
Monocytes Absolute: 0.5 10*3/uL (ref 0.1–1.0)
Neutro Abs: 5.5 10*3/uL (ref 1.7–7.7)
Neutrophils Relative %: 69 % (ref 43–77)
RDW: 12.9 % (ref 11.5–15.5)

## 2013-02-27 LAB — URINE MICROSCOPIC-ADD ON

## 2013-02-27 MED ORDER — SODIUM CHLORIDE 0.9 % IV BOLUS (SEPSIS)
500.0000 mL | Freq: Once | INTRAVENOUS | Status: AC
  Administered 2013-02-27: 500 mL via INTRAVENOUS

## 2013-02-27 NOTE — ED Notes (Signed)
Pt complains of dizziness and "just not feeling good" x 2 weeks.

## 2013-02-27 NOTE — ED Provider Notes (Signed)
History     CSN: [id number]  Arrival date & time 02/27/13  1501   First MD Initiated Contact with Patient 02/27/13 1614      Chief Complaint  Patient presents with  . Dizziness    (Consider location/radiation/quality/duration/timing/severity/associated sxs/prior treatment) Patient is a 79 y.o. female presenting with weakness. The history is provided by the patient.  Weakness Associated symptoms include weakness. Pertinent negatives include no abdominal pain, chest pain, congestion, coughing, diaphoresis, fever, headaches, myalgias, nausea, neck pain, numbness, rash, sore throat or vomiting. Associated symptoms comments: She reports feeling "funny in my head" for the past week intermittently. She denies headache or dizziness but feels that she cannot think clearly and feels generally weak. No N, V, pain, change of appetite, dysuria, SOB, cough. She reports a history of vertigo but that current symptoms are different from symptoms experienced with vertigo. No recent medication changes..    Past Medical History  Diagnosis Date  . Dyspepsia and other specified disorders of function of stomach   . Chronic kidney disease, stage I   . Heat stroke and sunstroke     pt states she doesn't remember this -04/30/12  . Other and unspecified hyperlipidemia   . Gout, unspecified   . Type II or unspecified type diabetes mellitus without mention of complication, not stated as uncontrolled   . Unspecified essential hypertension   . Hemorrhoid     Past Surgical History  Procedure Laterality Date  . Colon surgery    . Tonsillectomy      Family History  Problem Relation Age of Onset  . Diabetes Sister   . Breast cancer Sister   . Coronary artery disease Neg Hx   . Heart attack Neg Hx   . Cancer Sister     breast- recovered  . Stroke Son   . Hypertension Son   . Heart disease Mother   . Gout Father     History  Substance Use Topics  . Smoking status: Never Smoker   . Smokeless  tobacco: Never Used  . Alcohol Use: No    OB History   Grav Para Term Preterm Abortions TAB SAB Ect Mult Living                  Review of Systems  Constitutional: Negative for fever, diaphoresis and appetite change.  HENT: Negative for congestion, sore throat, neck pain and sinus pressure.   Eyes: Negative for visual disturbance.  Respiratory: Negative for cough and shortness of breath.   Cardiovascular: Negative for chest pain and palpitations.  Gastrointestinal: Negative for nausea, vomiting and abdominal pain.  Genitourinary: Negative for dysuria.  Musculoskeletal: Negative for myalgias.  Skin: Negative for rash.  Neurological: Positive for weakness. Negative for numbness and headaches.  Psychiatric/Behavioral: Negative for confusion.    Allergies  Ciprofloxacin and Diltiazem hcl  Home Medications   Current Outpatient Rx  Name  Route  Sig  Dispense  Refill  . allopurinol (ZYLOPRIM) 100 MG tablet   Oral   Take 100 mg by mouth daily.           Marland Kitchen ALPRAZolam (XANAX) 0.25 MG tablet      TAKE 1 TABLET BY MOUTH AS DIRECTED   30 tablet   3   . amLODipine (NORVASC) 10 MG tablet      TAKE 1 TABLET (10 MG TOTAL) BY MOUTH DAILY.   90 tablet   0   . atorvastatin (LIPITOR) 20 MG tablet   Oral   Take  20 mg by mouth daily.         . bumetanide (BUMEX) 2 MG tablet   Oral   Take 1 tablet (2 mg total) by mouth daily.   30 tablet   2     PT NEEDS PHYSICAL EXAM FOR FURTHER REFILLS.   . cloNIDine (CATAPRES) 0.1 MG tablet      TAKE 1 TABLET (0.1 MG TOTAL) BY MOUTH 2 (TWO) TIMES DAILY. FOR BLOOD PRESSURE.   60 tablet   5   . diclofenac (VOLTAREN) 50 MG EC tablet      TAKE 1 TABLET BY MOUTH TWICE A DAY   180 tablet   2   . glimepiride (AMARYL) 4 MG tablet      TAKE 1 TABLET BY MOUTH IN THE MORNING AND 1 EVERY EVENING   60 tablet   5   . hydrocortisone (ANUSOL-HC) 25 MG suppository      INSERT SUPPOSITORY RECTALLY TWO TIMES A DAY AS NEEDED   12  suppository   1   . losartan (COZAAR) 100 MG tablet   Oral   Take 1 tablet (100 mg total) by mouth daily.   90 tablet   0     PT NEEDS TO SCHEDULE A PHYSICAL EXAM   . meclizine (ANTIVERT) 12.5 MG tablet   Oral   Take 12.5 mg by mouth at bedtime as needed (vertigo.).         Marland Kitchen Multiple Vitamin (MULTIVITAMIN WITH MINERALS) TABS   Oral   Take 1 tablet by mouth daily.         Marland Kitchen omeprazole (PRILOSEC) 20 MG capsule      TAKE 2 CAPSULES BY MOUTH EVERY MORNING   180 capsule   3   . promethazine (PHENERGAN) 12.5 MG tablet   Oral   Take 1 tablet (12.5 mg total) by mouth every 6 (six) hours as needed for nausea.   20 tablet   1   . vitamin C (ASCORBIC ACID) 500 MG tablet   Oral   Take 500 mg by mouth daily.             BP 179/85  Pulse 95  Temp(Src) 98.3 F (36.8 C) (Oral)  Resp 20  SpO2 100%  Physical Exam  Constitutional: She is oriented to person, place, and time. She appears well-developed and well-nourished.  HENT:  Head: Normocephalic.  Eyes: Conjunctivae are normal.  No conjunctival pallor.  Neck: Normal range of motion. Neck supple.  No carotid bruit.  Cardiovascular: Normal rate and regular rhythm.   Pulmonary/Chest: Effort normal and breath sounds normal.  Abdominal: Soft. Bowel sounds are normal. There is no tenderness. There is no rebound and no guarding.  Musculoskeletal: Normal range of motion. She exhibits no edema.  Neurological: She is alert and oriented to person, place, and time. Coordination normal.  Skin: Skin is warm and dry. No rash noted.  Psychiatric: She has a normal mood and affect.    ED Course  Procedures (including critical care time)  Labs Reviewed  POCT I-STAT, CHEM 8 - Abnormal; Notable for the following:    Sodium 129 (*)    BUN 26 (*)    Creatinine, Ser 1.30 (*)    Glucose, Bld 208 (*)    All other components within normal limits  CBC WITH DIFFERENTIAL  URINALYSIS, ROUTINE W REFLEX MICROSCOPIC   Results for orders  placed during the hospital encounter of 02/27/13  CBC WITH DIFFERENTIAL      Result  Value Range   WBC 8.0  4.0 - 10.5 K/uL   RBC 4.20  3.87 - 5.11 MIL/uL   Hemoglobin 12.9  12.0 - 15.0 g/dL   HCT 45.4  09.8 - 11.9 %   MCV 87.1  78.0 - 100.0 fL   MCH 30.7  26.0 - 34.0 pg   MCHC 35.2  30.0 - 36.0 g/dL   RDW 14.7  82.9 - 56.2 %   Platelets 348  150 - 400 K/uL   Neutrophils Relative 69  43 - 77 %   Neutro Abs 5.5  1.7 - 7.7 K/uL   Lymphocytes Relative 23  12 - 46 %   Lymphs Abs 1.9  0.7 - 4.0 K/uL   Monocytes Relative 6  3 - 12 %   Monocytes Absolute 0.5  0.1 - 1.0 K/uL   Eosinophils Relative 1  0 - 5 %   Eosinophils Absolute 0.1  0.0 - 0.7 K/uL   Basophils Relative 1  0 - 1 %   Basophils Absolute 0.0  0.0 - 0.1 K/uL  URINALYSIS, ROUTINE W REFLEX MICROSCOPIC      Result Value Range   Color, Urine YELLOW  YELLOW   APPearance CLEAR  CLEAR   Specific Gravity, Urine 1.010  1.005 - 1.030   pH 6.5  5.0 - 8.0   Glucose, UA NEGATIVE  NEGATIVE mg/dL   Hgb urine dipstick TRACE (*) NEGATIVE   Bilirubin Urine NEGATIVE  NEGATIVE   Ketones, ur NEGATIVE  NEGATIVE mg/dL   Protein, ur 130 (*) NEGATIVE mg/dL   Urobilinogen, UA 0.2  0.0 - 1.0 mg/dL   Nitrite NEGATIVE  NEGATIVE   Leukocytes, UA SMALL (*) NEGATIVE  URINE MICROSCOPIC-ADD ON      Result Value Range   Squamous Epithelial / LPF FEW (*) RARE   WBC, UA 7-10  <3 WBC/hpf  POCT I-STAT, CHEM 8      Result Value Range   Sodium 129 (*) 135 - 145 mEq/L   Potassium 4.8  3.5 - 5.1 mEq/L   Chloride 96  96 - 112 mEq/L   BUN 26 (*) 6 - 23 mg/dL   Creatinine, Ser 8.65 (*) 0.50 - 1.10 mg/dL   Glucose, Bld 784 (*) 70 - 99 mg/dL   Calcium, Ion 6.96  2.95 - 1.30 mmol/L   TCO2 25  0 - 100 mmol/L   Hemoglobin 14.3  12.0 - 15.0 g/dL   HCT 28.4  13.2 - 44.0 %   Date: 02/27/2013  Rate: 89  Rhythm: normal sinus rhythm  QRS Axis: normal  Intervals: normal  ST/T Wave abnormalities: normal  Conduction Disutrbances:first-degree A-V block  and left  anterior fascicular block  Narrative Interpretation:   Old EKG Reviewed: unchanged    No results found.   No diagnosis found.  1. Weakness 2. Hyponatremia 3. Mild renal insufficiency   MDM  She is feeling slightly improved. Na 129, better than when previously seen (avg 123). She is stable for discharge and has PCP follow up in place. Patient examined by Dr. Denton Lank.        Arnoldo Hooker, PA-C 02/27/13 1806

## 2013-02-28 LAB — URINE CULTURE
Colony Count: NO GROWTH
Special Requests: NORMAL

## 2013-02-28 NOTE — ED Provider Notes (Signed)
Medical screening examination/treatment/procedure(s) were conducted as a shared visit with non-physician practitioner(s) and myself.  I personally evaluated the patient during the encounter Pt c/o transient gen weakness feeling earlier. No syncope or near syncope. No cp or sob. No fever/chills. Labs. Ecg/monitor.   Suzi Roots, MD 02/28/13 [phone]

## 2013-03-11 ENCOUNTER — Ambulatory Visit (INDEPENDENT_AMBULATORY_CARE_PROVIDER_SITE_OTHER): Payer: Medicare Other | Admitting: Internal Medicine

## 2013-03-11 ENCOUNTER — Encounter: Payer: Self-pay | Admitting: Internal Medicine

## 2013-03-11 VITALS — BP 168/88 | HR 106 | Temp 97.3°F | Wt 187.0 lb

## 2013-03-11 DIAGNOSIS — I10 Essential (primary) hypertension: Secondary | ICD-10-CM

## 2013-03-11 DIAGNOSIS — R5383 Other fatigue: Secondary | ICD-10-CM

## 2013-03-11 DIAGNOSIS — E871 Hypo-osmolality and hyponatremia: Secondary | ICD-10-CM

## 2013-03-11 DIAGNOSIS — E119 Type 2 diabetes mellitus without complications: Secondary | ICD-10-CM

## 2013-03-11 DIAGNOSIS — R531 Weakness: Secondary | ICD-10-CM

## 2013-03-11 MED ORDER — METOPROLOL TARTRATE 50 MG PO TABS: 50.0000 mg | ORAL_TABLET | Freq: Two times a day (BID) | ORAL | Status: DC

## 2013-03-11 NOTE — Progress Notes (Signed)
Subjective:    Patient ID: [name], female    DOB: 1926-03-19, 79 y.o.   MRN: [id number]  HPI  Patient of Dr. Debby Bud, here for emergency room followup Seen April 18 in the emergency room for weakness - found to have continued, chronic hyponatremia, no other changes Since home, symptoms have stabilized and generally improved  see PA student note today for other details  Past Medical History  Diagnosis Date  . Dyspepsia and other specified disorders of function of stomach   . Chronic kidney disease, stage I   . Heat stroke and sunstroke     pt states she doesn't remember this -04/30/12  . Other and unspecified hyperlipidemia   . Gout, unspecified   . Type II or unspecified type diabetes mellitus without mention of complication, not stated as uncontrolled   . Unspecified essential hypertension   . Hemorrhoid     Review of Systems Constitutional: Negative for fever or weight change.  Respiratory: Negative for cough and shortness of breath.   Cardiovascular: Negative for chest pain or palpitations.  Neurological: Negative for dizziness or headache.       Objective:   Physical Exam BP 182/88  Pulse 106  Temp(Src) 97.3 F (36.3 C) (Oral)  Wt 187 lb (84.823 kg)  BMI 32.08 kg/m2  SpO2 98% Wt Readings from Last 3 Encounters:  03/11/13 187 lb (84.823 kg)  04/30/12 187 lb 13.3 oz (85.2 kg)  03/05/12 193 lb (87.544 kg)   Constitutional: She is overweight, but appears well-developed and well-nourished. No distress. very animated  HENT: Head: Normocephalic and atraumatic. Ears: B TMs ok, no erythema or effusion; Nose: Nose normal. Mouth/Throat: Oropharynx is clear and moist. No oropharyngeal exudate.  Eyes: Conjunctivae and EOM are normal. Pupils are equal, round, and reactive to light. No scleral icterus.  Neck: Normal range of motion. Neck supple. No JVD present. No thyromegaly present.  Cardiovascular: Normal rate, regular rhythm and normal heart sounds.  No murmur heard.  No BLE edema. Pulmonary/Chest: Effort normal and breath sounds normal. No respiratory distress. She has no wheezes.  Abdominal: Soft. Bowel sounds are normal. She exhibits no distension. There is no tenderness. no masses Musculoskeletal: chronic OA changes - hands, knees Normal range of motion, no joint effusions. No gross deformities Neurological: She is alert and oriented to person, place, and time. No cranial nerve deficit. Coordination, balance, strength, speech and gait are normal.  Skin: Skin is warm and dry. No rash noted. No erythema.  Psychiatric: She has a normal mood and affect. Her behavior is normal. Judgment and thought content normal.   Lab Results  Component Value Date   WBC 8.0 02/27/2013   HGB 14.3 02/27/2013   HCT 42.0 02/27/2013   PLT 348 02/27/2013   GLUCOSE 208* 02/27/2013   CHOL 99 10/21/2009   TRIG 233.0* 10/21/2009   HDL 36.40* 10/21/2009   LDLDIRECT 41.7 10/21/2009   LDLCALC 48 01/18/2009   ALT 19 05/01/2012   AST 19 05/01/2012   NA 129* 02/27/2013   K 4.8 02/27/2013   CL 96 02/27/2013   CREATININE 1.30* 02/27/2013   BUN 26* 02/27/2013   CO2 25 05/01/2012   TSH 1.422 05/01/2012   HGBA1C 7.9* 10/21/2009      Assessment & Plan:   Weakness, nonspecific - emergency room evaluation April 18 reviewed, no changes recommended  Hyponatremia, chronic - no apparent symptoms related to same, continue current medications and followup with PCP as planned - reviewed diet - no need for  sodium restrictions despite hypertension   Type 2 diabetes - follows with endo for same [facility]) - reports well controlled overall  hypertension - uncontrolled - add beta-blocker - see below - follow up 4 weeks with MEN, sooner if problems

## 2013-03-11 NOTE — Progress Notes (Signed)
Subjective:    Patient ID: [name], female    DOB: December 16, 1925, 79 y.o.   MRN: [id number]  HPI  79 y.o. BF presents to the office for follow-up after ED visit 02/27/13.  Patient was seen for weakness.  Patient was diagnosed with hyponatremia and was given a fluid bolus.  She has chronic hyponatremia which she has been struggling with for the past 3-4 years.  States that she feels "pretty good" today.  She has been cutting back on her seasoning to keep her blood pressure down, but feels like this may be the reason why her hyponatremia may be worse lately.  States that she has had some nausea with her blood pressure meds so she takes half  in the morning and half of her medicine in the evening.  She reports compliance with her medication.  Sees [name] for her Diabetes.  Is due for a visit soon.  Says her sugar is doing good.   Review of Systems  Constitutional: Negative for activity change and fatigue.  Gastrointestinal: Negative for nausea and vomiting.  Neurological: Negative for dizziness, weakness, light-headedness and headaches.  All other systems reviewed and are negative.       Objective:   Physical Exam  Nursing note and vitals reviewed. Constitutional: She is oriented to person, place, and time. She appears well-developed and well-nourished. No distress.  HENT:  Head: Normocephalic and atraumatic.  Eyes: Conjunctivae are normal. No scleral icterus.  Neck: Normal range of motion. Neck supple. Carotid bruit is not present.  Cardiovascular: Normal rate, regular rhythm and normal heart sounds.  Exam reveals no gallop and no friction rub.   No murmur heard. Pulmonary/Chest: Effort normal and breath sounds normal. No respiratory distress. She has no wheezes. She has no rales. She exhibits no tenderness.  Neurological: She is alert and oriented to person, place, and time.  Skin: Skin is warm and dry.  Psychiatric: She has a normal mood and affect. Her behavior is normal.    Filed Vitals:   03/11/13 1012 03/11/13 1050  BP: 182/88 168/88  Pulse: 106   Temp: 97.3 F (36.3 C)   TempSrc: Oral   Weight: 187 lb (84.823 kg)   SpO2: 98%    Labs Reviewed   POCT I-STAT, CHEM 8 - Abnormal; Notable for the following:    Sodium  129 (*)     BUN  26 (*)     Creatinine, Ser  1.30 (*)     Glucose, Bld  208 (*)     All other components within normal limits   CBC WITH DIFFERENTIAL   URINALYSIS, ROUTINE W REFLEX MICROSCOPIC    Results for orders placed during the hospital encounter of 02/27/13   CBC WITH DIFFERENTIAL   Result  Value  Range    WBC  8.0  4.0 - 10.5 K/uL    RBC  4.20  3.87 - 5.11 MIL/uL    Hemoglobin  12.9  12.0 - 15.0 g/dL    HCT  40.9  81.1 - 91.4 %    MCV  87.1  78.0 - 100.0 fL    MCH  30.7  26.0 - 34.0 pg    MCHC  35.2  30.0 - 36.0 g/dL    RDW  78.2  95.6 - 21.3 %    Platelets  348  150 - 400 K/uL    Neutrophils Relative  69  43 - 77 %    Neutro Abs  5.5  1.7 - 7.7  K/uL    Lymphocytes Relative  23  12 - 46 %    Lymphs Abs  1.9  0.7 - 4.0 K/uL    Monocytes Relative  6  3 - 12 %    Monocytes Absolute  0.5  0.1 - 1.0 K/uL    Eosinophils Relative  1  0 - 5 %    Eosinophils Absolute  0.1  0.0 - 0.7 K/uL    Basophils Relative  1  0 - 1 %    Basophils Absolute  0.0  0.0 - 0.1 K/uL   URINALYSIS, ROUTINE W REFLEX MICROSCOPIC   Result  Value  Range    Color, Urine  YELLOW  YELLOW    APPearance  CLEAR  CLEAR    Specific Gravity, Urine  1.010  1.005 - 1.030    pH  6.5  5.0 - 8.0    Glucose, UA  NEGATIVE  NEGATIVE mg/dL    Hgb urine dipstick  TRACE (*)  NEGATIVE    Bilirubin Urine  NEGATIVE  NEGATIVE    Ketones, ur  NEGATIVE  NEGATIVE mg/dL    Protein, ur  161 (*)  NEGATIVE mg/dL    Urobilinogen, UA  0.2  0.0 - 1.0 mg/dL    Nitrite  NEGATIVE  NEGATIVE    Leukocytes, UA  SMALL (*)  NEGATIVE   URINE MICROSCOPIC-ADD ON   Result  Value  Range    Squamous Epithelial / LPF  FEW (*)  RARE    WBC, UA  7-10  <3 WBC/hpf   POCT I-STAT, CHEM 8    Result  Value  Range    Sodium  129 (*)  135 - 145 mEq/L    Potassium  4.8  3.5 - 5.1 mEq/L    Chloride  96  96 - 112 mEq/L    BUN  26 (*)  6 - 23 mg/dL    Creatinine, Ser  0.96 (*)  0.50 - 1.10 mg/dL    Glucose, Bld  045 (*)  70 - 99 mg/dL    Calcium, Ion  4.09  1.13 - 1.30 mmol/L    TCO2  25  0 - 100 mmol/L    Hemoglobin  14.3  12.0 - 15.0 g/dL    HCT  81.1  91.4 - 78.2 %   Date: 02/27/2013  Rate: 89  Rhythm: normal sinus rhythm  QRS Axis: normal  Intervals: normal  ST/T Wave abnormalities: normal  Conduction Disutrbances:first-degree A-V block and left anterior fascicular block  Narrative Interpretation:  Old EKG Reviewed: unchanged       Assessment & Plan:  79 y.o. BF presents to the office for f/u after ED visit for chronic hyponatremia  HTN:  Two consecutive blood pressure readings today in office of 182/88, and 168/88.  Patient reports compliance of medication.  Will add Metoprolol 50 mg PO BID.  Patient encouraged to check blood pressures at home.    Diabetes:  Patient to see [name] for diabetes maintenance.  Encouraged to make a visit.  Hyponatremia:  Patient encouraged to mildly increase sodium intake in diet.  Patient to follow up with Dr. Debby Bud in 4 weeks.         I have personally reviewed this case with PA student. I also personally examined this patient. I agree with history and findings as documented above. I reviewed, discussed and approve of the assessment and plan as listed above. Rene Paci, MD

## 2013-03-11 NOTE — Assessment & Plan Note (Addendum)
Uncontrolled -  Divides current meds into 1/2 dose BID (ie, dosing as listed reflective of home daily dose) Add metoprolol 50 BID - erx done follow up PCP in 4 weeks, sooner if problems

## 2013-03-11 NOTE — Patient Instructions (Signed)
It was good to see you today. We have reviewed your ER records including labs and tests today Medications reviewed and updated,  Add metoprolol 50 mg twice daily to your current blood pressure regimen -no other changes recommended at this time.  Your prescription(s) have been submitted to your pharmacy. Please take as directed and contact our office if you believe you are having problem(s) with the medication(s). Please schedule followup in 4 weeks for blood pressure check and review with Dr Debby Bud, call sooner if problems.  Hypertension Hypertension is another name for high blood pressure. High blood pressure may mean that your heart needs to work harder to pump blood. Blood pressure consists of two numbers, which includes a higher number over a lower number (example: 110/72). HOME CARE   Make lifestyle changes as told by your doctor. This may include weight loss and exercise.  Take your blood pressure medicine every day.  Limit how much salt you use.  Stop smoking if you smoke.  Do not use drugs.  Talk to your doctor if you are using decongestants or birth control pills. These medicines might make blood pressure higher.  Females should not drink more than 1 alcoholic drink per day. Males should not drink more than 2 alcoholic drinks per day.  See your doctor as told. GET HELP RIGHT AWAY IF:   You have a blood pressure reading with a top number of 180 or higher.  You get a very bad headache.  You get blurred or changing vision.  You feel confused.  You feel weak, numb, or faint.  You get chest or belly (abdominal) pain.  You throw up (vomit).  You cannot breathe very well. MAKE SURE YOU:   Understand these instructions.  Will watch your condition.  Will get help right away if you are not doing well or get worse. Document Released: 04/16/2008 Document Revised: 01/21/2012 Document Reviewed: 04/16/2008 [facility] Patient Information 2013 [place], [place].

## 2013-03-14 ENCOUNTER — Other Ambulatory Visit: Payer: Self-pay | Admitting: Internal Medicine

## 2013-04-13 ENCOUNTER — Other Ambulatory Visit: Payer: Self-pay | Admitting: Internal Medicine

## 2013-05-17 ENCOUNTER — Other Ambulatory Visit: Payer: Self-pay | Admitting: Internal Medicine

## 2013-05-18 NOTE — Telephone Encounter (Signed)
Okay for Alprazolam?  

## 2013-05-19 NOTE — Telephone Encounter (Signed)
Alprazolam called to pharmacy  

## 2013-06-16 ENCOUNTER — Other Ambulatory Visit: Payer: Self-pay | Admitting: Internal Medicine

## 2013-06-19 ENCOUNTER — Telehealth: Payer: Self-pay

## 2013-06-19 NOTE — Telephone Encounter (Signed)
Received a fax from [facility] [phone] that prior authorization is needed for Promethazine 12.5 mg tablet. Prior authorization form was submitted and approved through 06/18/2014. Approval was faxed to [facility] at [phone].

## 2013-07-18 ENCOUNTER — Other Ambulatory Visit: Payer: Self-pay | Admitting: Internal Medicine

## 2013-08-17 ENCOUNTER — Other Ambulatory Visit: Payer: Self-pay | Admitting: Internal Medicine

## 2013-09-20 ENCOUNTER — Other Ambulatory Visit: Payer: Self-pay | Admitting: Internal Medicine

## 2013-10-13 ENCOUNTER — Other Ambulatory Visit: Payer: Self-pay | Admitting: Internal Medicine

## 2013-10-27 ENCOUNTER — Encounter: Payer: Self-pay | Admitting: Internal Medicine

## 2013-10-27 ENCOUNTER — Ambulatory Visit (INDEPENDENT_AMBULATORY_CARE_PROVIDER_SITE_OTHER): Payer: Medicare Other | Admitting: Internal Medicine

## 2013-10-27 VITALS — BP 148/88 | HR 61 | Temp 96.4°F | Wt 187.0 lb

## 2013-10-27 DIAGNOSIS — E119 Type 2 diabetes mellitus without complications: Secondary | ICD-10-CM

## 2013-10-27 DIAGNOSIS — N181 Chronic kidney disease, stage 1: Secondary | ICD-10-CM

## 2013-10-27 DIAGNOSIS — I10 Essential (primary) hypertension: Secondary | ICD-10-CM

## 2013-10-27 DIAGNOSIS — E871 Hypo-osmolality and hyponatremia: Secondary | ICD-10-CM

## 2013-10-27 NOTE — Assessment & Plan Note (Signed)
Advised patient to continue on her regimen with salt tablets and to watch her fluid intake. Advised her to limit water intake to six 8 oz glasses of water a day.  Repeat basic metabolic panel today.

## 2013-10-27 NOTE — Patient Instructions (Addendum)
Pleasure seeing you today [name]!  1) We are not going to change any of your current medications today.   2) Please follow up with [name] regarding management of your diabetes. Please schedule an appointment with him at the start of the new year.   3) Please schedule an appointment to see the eye doctor. You should see the eye doctor every year.   4) You have occasional premature heart beat. This is common and nothing to be concerned about.   5) We are going to draw some labs today to check your sodium. Try not to drink more than six 8 oz glasses of water each day.   6) Please think about advance care planning and what you wish to have done in the future if your health were to decline. Please get one of your family members to look at the web-site: [email or web address] with you. You should think about establishing a health care power of attorney.  This is an important issue to talk with your family about.

## 2013-10-27 NOTE — Progress Notes (Signed)
Pre visit review using our clinic review tool, if applicable. No additional management support is needed unless otherwise documented below in the visit note. 

## 2013-10-27 NOTE — Progress Notes (Signed)
Subjective:     Patient ID: [name], female   DOB: [birth year], 79 y.o.   MRN: [id number]  HPI [name] is a 79 year old with PMH of DMII, CKD stage I, history of chronic hyponatremia, dyslipidemia and HTN who presents today for follow up on her HTN and low sodium.   She is checking her BP regularly at home. Her BP is running around 140/70 at home. Her BP is running high today because she is nervous she says. She denies any symptoms from low BP or high BP. She is regularly taking her medications.   She denies any symptoms of low sodium at this time including dizziness/confusion. She is taking a salt pill.  She has occasional vertigo for which she takes meclizine. She does use some exercises to help her with vertigo. She regularly checks her sugars her home. They usually run around 175-180. She has an endocrinologist [name]  She occasionally has "spells" where she feel overwhelmed when she begins to think about everything she has to do. These spells last around a minute or two then they pass. She believes these are related to anxiety/nerves. She says these events happen every couple of months.   Discussion of advanced care planning was bridged. She seemed willing to discuss this topic.   Past Medical History  Diagnosis Date  . Dyspepsia and other specified disorders of function of stomach   . Chronic kidney disease, stage I   . Heat stroke and sunstroke     pt states she doesn't remember this -04/30/12  . Other and unspecified hyperlipidemia   . Gout, unspecified   . Type II or unspecified type diabetes mellitus without mention of complication, not stated as uncontrolled   . Unspecified essential hypertension   . Hemorrhoid    Past Surgical History  Procedure Laterality Date  . Colon surgery    . Tonsillectomy     Family History  Problem Relation Age of Onset  . Diabetes Sister   . Breast cancer Sister   . Coronary artery disease Neg Hx   . Heart attack Neg Hx   . Cancer  Sister     breast- recovered  . Stroke Son   . Hypertension Son   . Heart disease Mother   . Gout Father    History   Social History  . Marital Status: Widowed    Spouse Name: N/A    Number of Children: N/A  . Years of Education: N/A   Occupational History  . food service [employer]   Social History Main Topics  . Smoking status: Never Smoker   . Smokeless tobacco: Never Used  . Alcohol Use: No  . Drug Use: No  . Sexual Activity: Not on file   Other Topics Concern  . Not on file   Social History Narrative   HSG. married '59-widowed '99.  1 son - '59; 2 daughter - '69, '79; grandchildren 4; great-grandchildren 3. Lives alone. work: [employer] since '71-retired '11.    Current Outpatient Prescriptions on File Prior to Visit  Medication Sig Dispense Refill  . allopurinol (ZYLOPRIM) 100 MG tablet Take 100 mg by mouth daily.        Marland Kitchen ALPRAZolam (XANAX) 0.25 MG tablet TAKE 1 TABLET BY MOUTH DAILY AS DIRECTED  30 tablet  3  . amLODipine (NORVASC) 10 MG tablet TAKE 1 TABLET (10 MG TOTAL) BY MOUTH DAILY.  90 tablet  0  . atorvastatin (LIPITOR) 20 MG tablet Take  20 mg by mouth daily.      . bumetanide (BUMEX) 2 MG tablet TAKE 1 TABLET (2 MG TOTAL) BY MOUTH DAILY.  30 tablet  2  . cloNIDine (CATAPRES) 0.1 MG tablet TAKE 1 TABLET (0.1 MG TOTAL) BY MOUTH 2 (TWO) TIMES DAILY. FOR BLOOD PRESSURE.  60 tablet  5  . diclofenac (VOLTAREN) 50 MG EC tablet TAKE 1 TABLET BY MOUTH TWICE A DAY  180 tablet  2  . glimepiride (AMARYL) 4 MG tablet TAKE 1 TABLET BY MOUTH EVERY MORNING AND EVERY EVENING  60 tablet  5  . hydrocortisone (ANUSOL-HC) 25 MG suppository INSERT SUPPOSITORY RECTALLY TWO TIMES A DAY AS NEEDED  12 suppository  1  . losartan (COZAAR) 100 MG tablet TAKE 1 TABLET (100 MG TOTAL) BY MOUTH DAILY.  90 tablet  3  . meclizine (ANTIVERT) 12.5 MG tablet Take 12.5 mg by mouth at bedtime as needed (vertigo.).      Marland Kitchen metoprolol (LOPRESSOR) 50 MG tablet Take 1 tablet (50 mg total) by mouth  2 (two) times daily.  180 tablet  3  . Multiple Vitamin (MULTIVITAMIN WITH MINERALS) TABS Take 1 tablet by mouth daily.      Marland Kitchen omeprazole (PRILOSEC) 20 MG capsule TAKE 2 CAPSULES BY MOUTH EVERY MORNING  180 capsule  3  . ONE TOUCH ULTRA TEST test strip Use to check blood sugar twice a day      . promethazine (PHENERGAN) 12.5 MG tablet TAKE 1 TABLET EVERY 6 HOURS AS NEEDED FOR NAUSEA.  20 tablet  1  . vitamin C (ASCORBIC ACID) 500 MG tablet Take 500 mg by mouth daily.        . [DISCONTINUED] simvastatin (ZOCOR) 40 MG tablet Take 20 mg by mouth at bedtime.         No current facility-administered medications on file prior to visit.     Review of Systems Negative except as mentioned in HPI.     Objective:   Physical Exam Gen: Well appearing woman in no acute distress.  HEENT: Atraumatic. Antiicteric sclerae. PEERL.  CV: Irregular rhythm with occasional premature beats. No murmurs, rubs or gallops. 2+ radialis pulses. Pulm: Lungs clear to auscultation bilaterally. No crackles, wheezes or rales.  Abd: Soft, non-distended and non-tender to palpation.  Ext: No edema.  Neuro: AOx3. Top of feet have intact sensation to pinprick bilaterally.      Assessment and Plan:     [name] is a 79 year old with PMH of DMII, CKD stage I, history of chronic hyponatremia, dyslipidemia and HTN who presents today for follow up on her HTN and low sodium.   1) HTN: Blood pressure is well controlled on her current regimen (Norvasc, metoprolol, bumex and clonidine)  2) Chronic Hyponatremia: Advised patient to continue on her regimen with salt tablets and to watch her fluid intake. Advised her to limit water intake to six 8 oz a day.  Repeat basic metabolic panel today.   3) Diabetes: Continue on current regimen. Defer any changes in management to [name].   4) Advanced care planning: Discussion regarding advanced care planning was brought up. She was provided with informational materials regarding this  issue. Referred to [email or web address] and encouraged her to get help with this from her grandchildren and to talk about this with her family.

## 2013-10-27 NOTE — Assessment & Plan Note (Signed)
HTN: Blood pressure is well controlled on her current regimen (Norvasc, metoprolol, bumex and clonidine)  10/27/2013 BP: 148/88

## 2013-10-28 ENCOUNTER — Encounter: Payer: Self-pay | Admitting: Internal Medicine

## 2013-10-28 NOTE — Assessment & Plan Note (Signed)
BMET    Component Value Date/Time   NA 129* 02/27/2013 1608   K 4.8 02/27/2013 1608   CL 96 02/27/2013 1608   CO2 25 05/01/2012 0410   GLUCOSE 208* 02/27/2013 1608   BUN 26* 02/27/2013 1608   CREATININE 1.30* 02/27/2013 1608   CALCIUM 9.8 05/01/2012 0410   GFRNONAA 43* 05/01/2012 0410   GFRAA 50* 05/01/2012 0410    More recent labs per [name]. Appears to be stable

## 2013-10-28 NOTE — Assessment & Plan Note (Signed)
[name] continues to follow with [name], who she sees q 6 months.

## 2013-11-16 ENCOUNTER — Other Ambulatory Visit: Payer: Self-pay | Admitting: Internal Medicine

## 2013-12-14 ENCOUNTER — Other Ambulatory Visit: Payer: Self-pay | Admitting: Internal Medicine

## 2014-02-01 ENCOUNTER — Other Ambulatory Visit: Payer: Self-pay | Admitting: Internal Medicine

## 2014-02-15 ENCOUNTER — Other Ambulatory Visit: Payer: Self-pay

## 2014-02-15 MED ORDER — DICLOFENAC SODIUM 50 MG PO TBEC: DELAYED_RELEASE_TABLET | ORAL | Status: DC

## 2014-03-15 ENCOUNTER — Other Ambulatory Visit: Payer: Self-pay | Admitting: Internal Medicine

## 2014-03-23 ENCOUNTER — Other Ambulatory Visit: Payer: Self-pay

## 2014-03-23 MED ORDER — GLIMEPIRIDE 4 MG PO TABS: ORAL_TABLET | ORAL | Status: DC

## 2014-04-19 ENCOUNTER — Other Ambulatory Visit: Payer: Self-pay | Admitting: *Deleted

## 2014-04-19 MED ORDER — BUMETANIDE 2 MG PO TABS: 2.0000 mg | ORAL_TABLET | Freq: Every day | ORAL | Status: DC

## 2014-05-07 ENCOUNTER — Emergency Department (HOSPITAL_COMMUNITY)
Admission: EM | Admit: 2014-05-07 | Discharge: 2014-05-07 | Disposition: A | Discharge status: Home / Self Care | Payer: Medicare Other | Attending: Emergency Medicine | Admitting: Emergency Medicine

## 2014-05-07 ENCOUNTER — Encounter (HOSPITAL_COMMUNITY): Payer: Self-pay | Admitting: Emergency Medicine

## 2014-05-07 DIAGNOSIS — E785 Hyperlipidemia, unspecified: Secondary | ICD-10-CM | POA: Insufficient documentation

## 2014-05-07 DIAGNOSIS — Z8719 Personal history of other diseases of the digestive system: Secondary | ICD-10-CM | POA: Insufficient documentation

## 2014-05-07 DIAGNOSIS — N181 Chronic kidney disease, stage 1: Secondary | ICD-10-CM | POA: Insufficient documentation

## 2014-05-07 DIAGNOSIS — Z79899 Other long term (current) drug therapy: Secondary | ICD-10-CM | POA: Insufficient documentation

## 2014-05-07 DIAGNOSIS — I129 Hypertensive chronic kidney disease with stage 1 through stage 4 chronic kidney disease, or unspecified chronic kidney disease: Secondary | ICD-10-CM | POA: Insufficient documentation

## 2014-05-07 DIAGNOSIS — E119 Type 2 diabetes mellitus without complications: Secondary | ICD-10-CM | POA: Insufficient documentation

## 2014-05-07 DIAGNOSIS — E871 Hypo-osmolality and hyponatremia: Secondary | ICD-10-CM | POA: Insufficient documentation

## 2014-05-07 DIAGNOSIS — Z791 Long term (current) use of non-steroidal anti-inflammatories (NSAID): Secondary | ICD-10-CM | POA: Insufficient documentation

## 2014-05-07 LAB — I-STAT CHEM 8, ED
BUN: 27 mg/dL — ABNORMAL HIGH (ref 6–23)
CHLORIDE: 86 meq/L — AB (ref 96–112)
Calcium, Ion: 1.2 mmol/L (ref 1.13–1.30)
Creatinine, Ser: 1.7 mg/dL — ABNORMAL HIGH (ref 0.50–1.10)
Glucose, Bld: 126 mg/dL — ABNORMAL HIGH (ref 70–99)
HEMATOCRIT: 40 % (ref 36.0–46.0)
Hemoglobin: 13.6 g/dL (ref 12.0–15.0)
POTASSIUM: 4.9 meq/L (ref 3.7–5.3)
Sodium: 123 mEq/L — ABNORMAL LOW (ref 137–147)
TCO2: 24 mmol/L (ref 0–100)

## 2014-05-07 MED ORDER — SODIUM CHLORIDE 0.9 % IV BOLUS (SEPSIS)
1000.0000 mL | Freq: Once | INTRAVENOUS | Status: AC
Start: 2014-05-07 — End: 2014-05-07
  Administered 2014-05-07: 1000 mL via INTRAVENOUS

## 2014-05-07 NOTE — Discharge Instructions (Signed)
He Hyponatremia Do not take any Bumex (Bumetanide) for the next 3 days. Call Dr. Rinaldo [facility]'s office on Monday, 05/10/2014 To arrange to be seen that day. Asked Dr. Evlyn [facility]annerSouth recheck your blood sodium and kidney function  Hyponatremia is when the salt (sodium) in your blood is low. When salt becomes low, your cells take in extra water and puff up (swell). The puffiness can happen in the whole body. It mostly affects the brain and is very serious.  HOME CARE  Only take medicine as told by your doctor.  Follow any diet instructions you were given. This includes limiting how much fluid you drink.  Keep all doctor visits for tests as told.  Avoid alcohol and drugs. GET HELP RIGHT AWAY IF:  You start to twitch and shake (seize).  You pass out (faint).  You continue to have watery poop (diarrhea) or you throw up (vomit).  You feel sick to your stomach (nauseous).  You are tired (fatigued), have a headache, are confused, or feel weak.  Your problems that first brought you to the doctor come back.  You have trouble following your diet instructions. MAKE SURE YOU:   Understand these instructions.  Will watch your condition.  Will get help right away if you are not doing well or get worse. Document Released: 07/11/2011 Document Revised: 01/21/2012 Document Reviewed: 07/11/2011 [facility] Patient Information 2015 [place], [place]. This information is not intended to replace advice given to you by your health care provider. Make sure you discuss any questions you have with your health care provider.

## 2014-05-07 NOTE — ED Provider Notes (Addendum)
CSN: [id number]     Arrival date & time 05/07/14  1415 History   First MD Initiated Contact with Patient 05/07/14 1515     Chief Complaint  Patient presents with  . Dizziness   History of present illness Patient complains of weakness in both legs onset several weeks ago. She also reports that "my head feels big" onset yesterday. She denies headache denies visual changes she reports that the last time she had a similar sensation of her head feeling big she have low sodium. No treatment prior to coming here no visual changes. No other associated symptoms. Denies noncompliance with medications. She denies . vertigo. No fever. No other associated.. No treatment prior to coming patient's stressors she doesn't hurt anywhere (Consider location/radiation/quality/duration/timing/severity/associated sxs/prior Treatment) HPI  Past Medical History  Diagnosis Date  . Dyspepsia and other specified disorders of function of stomach   . Chronic kidney disease, stage I   . Heat stroke and sunstroke     pt states she doesn't remember this -04/30/12  . Other and unspecified hyperlipidemia   . Gout, unspecified   . Type II or unspecified type diabetes mellitus without mention of complication, not stated as uncontrolled   . Unspecified essential hypertension   . Hemorrhoid    Past Surgical History  Procedure Laterality Date  . Colon surgery    . Tonsillectomy     Family History  Problem Relation Age of Onset  . Diabetes Sister   . Breast cancer Sister   . Coronary artery disease Neg Hx   . Heart attack Neg Hx   . Cancer Sister     breast- recovered  . Stroke Son   . Hypertension Son   . Heart disease Mother   . Gout Father    History  Substance Use Topics  . Smoking status: Never Smoker   . Smokeless tobacco: Never Used  . Alcohol Use: No   OB History   Grav Para Term Preterm Abortions TAB SAB Ect Mult Living                 Review of Systems  HENT: Negative.   Respiratory: Negative.    Cardiovascular: Negative.   Gastrointestinal: Negative.   Musculoskeletal: Negative.   Skin: Negative.   Neurological: Positive for weakness.  Psychiatric/Behavioral: Negative.   All other systems reviewed and are negative.     Allergies  Ciprofloxacin and Diltiazem hcl  Home Medications   Prior to Admission medications   Medication Sig Start Date End Date Taking? Authorizing Sharanda Shinault  ALPRAZolam Prudy Feeler(XANAX) 0.5 MG tablet Take 0.5 mg by mouth daily as needed for anxiety.   Yes Historical Ilianna Bown, MD  amLODipine (NORVASC) 10 MG tablet Take 10 mg by mouth daily.   Yes Historical Oluwakemi Salsberry, MD  atorvastatin (LIPITOR) 20 MG tablet Take 20 mg by mouth daily.   Yes Historical Delton Stelle, MD  bumetanide (BUMEX) 2 MG tablet Take 2 mg by mouth daily.   Yes Historical Zarai Orsborn, MD  cloNIDine (CATAPRES) 0.1 MG tablet Take 0.1 mg by mouth 2 (two) times daily.   Yes Historical Miata Culbreth, MD  diclofenac (VOLTAREN) 50 MG EC tablet Take 50 mg by mouth 2 (two) times daily.   Yes Historical Lauris Serviss, MD  glimepiride (AMARYL) 4 MG tablet Take 4 mg by mouth 2 (two) times daily.   Yes Historical Wilma Michaelson, MD  losartan (COZAAR) 100 MG tablet Take 100 mg by mouth daily.   Yes Historical Despina Boan, MD  meclizine (ANTIVERT) 12.5 MG tablet Take  12.5 mg by mouth at bedtime as needed for dizziness.   Yes Historical Raeleigh Guinn, MD  OVER THE COUNTER MEDICATION Take 1 tablet by mouth daily. Over the counter salt tablets   Yes Historical Noami Bove, MD  promethazine (PHENERGAN) 12.5 MG tablet Take 12.5 mg by mouth every 6 (six) hours as needed for nausea or vomiting.   Yes Historical Jin Capote, MD   BP 172/72  Pulse 79  Temp(Src) 99 F (37.2 C) (Oral)  Resp 16  SpO2 100% Physical Exam  Nursing note and vitals reviewed. Constitutional: She is oriented to person, place, and time. She appears well-developed and well-nourished.  HENT:  Head: Normocephalic and atraumatic.  Eyes: Conjunctivae are normal. Pupils are equal,  round, and reactive to light.  Neck: Neck supple. No tracheal deviation present. No thyromegaly present.  Cardiovascular: Normal rate and regular rhythm.   No murmur heard. Pulmonary/Chest: Effort normal and breath sounds normal.  Abdominal: Soft. Bowel sounds are normal. She exhibits no distension. There is no tenderness.  Musculoskeletal: Normal range of motion. She exhibits no edema and no tenderness.  Neurological: She is alert and oriented to person, place, and time. No cranial nerve deficit. Coordination normal.  Gait normal Romberg normal prior drift normal. DTRs symmetric bilaterally at knee jerk ankle jerk biceps was ordered bilaterally  Skin: Skin is warm and dry. No rash noted.  Psychiatric: She has a normal mood and affect.    ED Course  Procedures (including critical care time) Labs Review Labs Reviewed - No data to display  Imaging Review No results found.   EKG Interpretation None     6 p.m. patient feels well to go home, asymptomatic after treatment with 1 L normal saline intravenously. Results for orders placed during the hospital encounter of 05/07/14  I-STAT CHEM 8, ED      Result Value Ref Range   Sodium 123 (*) 137 - 147 mEq/L   Potassium 4.9  3.7 - 5.3 mEq/L   Chloride 86 (*) 96 - 112 mEq/L   BUN 27 (*) 6 - 23 mg/dL   Creatinine, Ser 0.861.70 (*) 0.50 - 1.10 mg/dL   Glucose, Bld 578126 (*) 70 - 99 mg/dL   Calcium, Ion 4.691.20  6.291.13 - 1.30 mmol/L   TCO2 24  0 - 100 mmol/L   Hemoglobin 13.6  12.0 - 15.0 g/dL   HCT 52.840.0  41.336.0 - 24.446.0 %   No results found.  MDM  Spoke with Dr. Jarold MottoPatterson plan patient is to stop Bumex next 3 days CC Dr. Evlyn [facility]South in the office in 3 days for recheck of serum sodium and renal function Final diagnoses:  None   hyponatremia is chronic over 2 years Diagnosis #1 hyponatremia #2 weakness #3 renal insufficiency     Doug SouSam Jacubowitz, MD 05/07/14 1812  Doug SouSam Jacubowitz, MD 05/08/14 [id number]

## 2014-05-07 NOTE — ED Notes (Signed)
Pt able to make full sentences with no speech changes.  No changes in facial/grip.  No acute visual sign/symptoms of stroke noted on quick assessment

## 2014-05-07 NOTE — ED Notes (Signed)
Pt states she has hx of vertigo and takes meds.  Cannot describe in exact details specific complaint.  States her head "feels big" and doesn't feel right.  No headache.  No other symptoms.  Pt does say she if a little off balance at times and feels today that she is "off balance".  Pt says she also has hx of low sodium.

## 2014-06-14 ENCOUNTER — Other Ambulatory Visit: Payer: Self-pay | Admitting: Internal Medicine

## 2014-07-05 ENCOUNTER — Other Ambulatory Visit: Payer: Self-pay

## 2014-07-05 NOTE — Telephone Encounter (Signed)
Denying refill for omeprazole. Rx is not on med list and [facility] is still showing as PCP.

## 2014-07-14 ENCOUNTER — Other Ambulatory Visit: Payer: Self-pay | Admitting: Internal Medicine

## 2014-07-14 MED ORDER — LOSARTAN POTASSIUM 100 MG PO TABS: 100.0000 mg | ORAL_TABLET | Freq: Every day | ORAL | Status: DC

## 2014-08-19 ENCOUNTER — Other Ambulatory Visit: Payer: Self-pay

## 2014-11-11 ENCOUNTER — Other Ambulatory Visit: Payer: Self-pay | Admitting: Geriatric Medicine

## 2014-11-11 MED ORDER — AMLODIPINE BESYLATE 10 MG PO TABS: 10.0000 mg | ORAL_TABLET | Freq: Every day | ORAL | Status: DC

## 2014-11-15 ENCOUNTER — Other Ambulatory Visit: Payer: Self-pay | Admitting: Internal Medicine

## 2014-11-17 ENCOUNTER — Other Ambulatory Visit: Payer: Self-pay | Admitting: *Deleted

## 2014-11-17 MED ORDER — AMLODIPINE BESYLATE 10 MG PO TABS: 10.0000 mg | ORAL_TABLET | Freq: Every day | ORAL | Status: DC

## 2014-12-20 ENCOUNTER — Other Ambulatory Visit: Payer: Self-pay | Admitting: Internal Medicine

## 2015-01-08 ENCOUNTER — Other Ambulatory Visit: Payer: Self-pay | Admitting: Internal Medicine

## 2015-01-14 ENCOUNTER — Other Ambulatory Visit: Payer: Self-pay | Admitting: Internal Medicine

## 2015-01-28 ENCOUNTER — Other Ambulatory Visit: Payer: Self-pay | Admitting: Internal Medicine

## 2015-03-05 ENCOUNTER — Encounter (HOSPITAL_COMMUNITY): Payer: Self-pay | Admitting: *Deleted

## 2015-03-05 ENCOUNTER — Emergency Department (HOSPITAL_COMMUNITY): Payer: Medicare Other

## 2015-03-05 ENCOUNTER — Emergency Department (HOSPITAL_COMMUNITY)
Admission: EM | Admit: 2015-03-05 | Discharge: 2015-03-05 | Disposition: A | Discharge status: Home / Self Care | Payer: Medicare Other | Attending: Emergency Medicine | Admitting: Emergency Medicine

## 2015-03-05 DIAGNOSIS — Z79899 Other long term (current) drug therapy: Secondary | ICD-10-CM | POA: Insufficient documentation

## 2015-03-05 DIAGNOSIS — Z8739 Personal history of other diseases of the musculoskeletal system and connective tissue: Secondary | ICD-10-CM | POA: Diagnosis not present

## 2015-03-05 DIAGNOSIS — R42 Dizziness and giddiness: Secondary | ICD-10-CM | POA: Diagnosis present

## 2015-03-05 DIAGNOSIS — Z791 Long term (current) use of non-steroidal anti-inflammatories (NSAID): Secondary | ICD-10-CM | POA: Diagnosis not present

## 2015-03-05 DIAGNOSIS — E871 Hypo-osmolality and hyponatremia: Secondary | ICD-10-CM | POA: Diagnosis not present

## 2015-03-05 DIAGNOSIS — E119 Type 2 diabetes mellitus without complications: Secondary | ICD-10-CM | POA: Insufficient documentation

## 2015-03-05 DIAGNOSIS — N181 Chronic kidney disease, stage 1: Secondary | ICD-10-CM | POA: Insufficient documentation

## 2015-03-05 DIAGNOSIS — I129 Hypertensive chronic kidney disease with stage 1 through stage 4 chronic kidney disease, or unspecified chronic kidney disease: Secondary | ICD-10-CM | POA: Diagnosis not present

## 2015-03-05 LAB — BASIC METABOLIC PANEL
ANION GAP: 11 (ref 5–15)
BUN: 27 mg/dL — ABNORMAL HIGH (ref 6–23)
CALCIUM: 9.4 mg/dL (ref 8.4–10.5)
CO2: 24 mmol/L (ref 19–32)
CREATININE: 1.58 mg/dL — AB (ref 0.50–1.10)
Chloride: 92 mmol/L — ABNORMAL LOW (ref 96–112)
GFR calc Af Amer: 33 mL/min — ABNORMAL LOW (ref >90)
GFR, EST NON AFRICAN AMERICAN: 28 mL/min — AB (ref >90)
Glucose, Bld: 176 mg/dL — ABNORMAL HIGH (ref 70–99)
Potassium: 4.8 mmol/L (ref 3.5–5.1)
Sodium: 127 mmol/L — ABNORMAL LOW (ref 135–145)

## 2015-03-05 LAB — HEPATIC FUNCTION PANEL
ALBUMIN: 4.3 g/dL (ref 3.5–5.2)
ALK PHOS: 113 U/L (ref 39–117)
ALT: 21 U/L (ref 0–35)
AST: 26 U/L (ref 0–37)
Bilirubin, Direct: <0.1 mg/dL (ref 0.0–0.5)
Total Bilirubin: 0.3 mg/dL (ref 0.3–1.2)
Total Protein: 8.5 g/dL — ABNORMAL HIGH (ref 6.0–8.3)

## 2015-03-05 LAB — CBG MONITORING, ED: Glucose-Capillary: 172 mg/dL — ABNORMAL HIGH (ref 70–99)

## 2015-03-05 LAB — CBC
HCT: 36.5 % (ref 36.0–46.0)
HEMOGLOBIN: 12.5 g/dL (ref 12.0–15.0)
MCH: 30.4 pg (ref 26.0–34.0)
MCHC: 34.2 g/dL (ref 30.0–36.0)
MCV: 88.8 fL (ref 78.0–100.0)
Platelets: 362 10*3/uL (ref 150–400)
RBC: 4.11 MIL/uL (ref 3.87–5.11)
RDW: 13 % (ref 11.5–15.5)
WBC: 6.3 10*3/uL (ref 4.0–10.5)

## 2015-03-05 LAB — URINALYSIS, ROUTINE W REFLEX MICROSCOPIC
Bilirubin Urine: NEGATIVE
Glucose, UA: NEGATIVE mg/dL
HGB URINE DIPSTICK: NEGATIVE
Ketones, ur: NEGATIVE mg/dL
Nitrite: NEGATIVE
Protein, ur: 100 mg/dL — AB
Specific Gravity, Urine: <1.005 — ABNORMAL LOW (ref 1.005–1.030)
UROBILINOGEN UA: 0.2 mg/dL (ref 0.0–1.0)
pH: 7 (ref 5.0–8.0)

## 2015-03-05 LAB — URINE MICROSCOPIC-ADD ON

## 2015-03-05 LAB — TROPONIN I: Troponin I: <0.03 ng/mL (ref <0.031)

## 2015-03-05 MED ORDER — SODIUM CHLORIDE 0.9 % IV BOLUS (SEPSIS)
1000.0000 mL | Freq: Once | INTRAVENOUS | Status: AC
  Administered 2015-03-05: 1000 mL via INTRAVENOUS

## 2015-03-05 MED ORDER — SODIUM CHLORIDE 0.9 % IV BOLUS (SEPSIS): 500.0000 mL | Freq: Once | INTRAVENOUS | Status: DC

## 2015-03-05 NOTE — ED Notes (Addendum)
PA at bedside.

## 2015-03-05 NOTE — ED Notes (Signed)
Pt ambulated to restroom for urine sample. Pt ambulated without assistance.

## 2015-03-05 NOTE — ED Notes (Signed)
Pt reports feeling "my head is big. It feels like it's swoll."  Pt's family reports that they had to assist her coming down the stairs today d/t weakness.  Pt reports hx of vertigo and takes med for it.  Family reports the last time she had this same episode about a year ago, she was found to have hyponatremia.  Pt is A&Ox 4.

## 2015-03-05 NOTE — Discharge Instructions (Signed)
Please follow with your primary care doctor in the next 2 days for a check-up. They must obtain records for further management.   Do not hesitate to return to the Emergency Department for any new, worsening or concerning symptoms.    Hyponatremia  Hyponatremia is when the amount of salt (sodium) in your blood is too low. When sodium levels are low, your cells will absorb extra water and swell. The swelling happens throughout the body, but it mostly affects the brain. Severe brain swelling (cerebral edema), seizures, or coma can happen.  CAUSES   Heart, kidney, or liver problems.  Thyroid problems.  Adrenal gland problems.  Severe vomiting and diarrhea.  Certain medicines or illegal drugs.  Dehydration.  Drinking too much water.  Low-sodium diet. SYMPTOMS   Nausea and vomiting.  Confusion.  Lethargy.  Agitation.  Headache.  Twitching or shaking (seizures).  Unconsciousness.  Appetite loss.  Muscle weakness and cramping. DIAGNOSIS  Hyponatremia is identified by a simple blood test. Your caregiver will perform a history and physical exam to try to find the cause and type of hyponatremia. Other tests may be needed to measure the amount of sodium in your blood and urine. TREATMENT  Treatment will depend on the cause.   Fluids may be given through the vein (IV).  Medicines may be used to correct the sodium imbalance. If medicines are causing the problem, they will need to be adjusted.  Water or fluid intake may be restricted to restore proper balance. The speed of correcting the sodium problem is very important. If the problem is corrected too fast, nerve damage (sometimes unchangeable) can happen. HOME CARE INSTRUCTIONS   Only take medicines as directed by your caregiver. Many medicines can make hyponatremia worse. Discuss all your medicines with your caregiver.  Carefully follow any recommended diet, including any fluid restrictions.  You may be asked to repeat  lab tests. Follow these directions.  Avoid alcohol and recreational drugs. SEEK MEDICAL CARE IF:   You develop worsening nausea, fatigue, headache, confusion, or weakness.  Your original hyponatremia symptoms return.  You have problems following the recommended diet. SEEK IMMEDIATE MEDICAL CARE IF:   You have a seizure.  You faint.  You have ongoing diarrhea or vomiting. MAKE SURE YOU:   Understand these instructions.  Will watch your condition.  Will get help right away if you are not doing well or get worse. Document Released: 10/19/2002 Document Revised: 01/21/2012 Document Reviewed: 04/15/2011 [facility] Patient Information 2015 [place], [place]. This information is not intended to replace advice given to you by your health care provider. Make sure you discuss any questions you have with your health care provider.

## 2015-03-05 NOTE — ED Provider Notes (Signed)
CSN: [id number]     Arrival date & time 03/05/15  1525 History   First MD Initiated Contact with Patient 03/05/15 1549     Chief Complaint  Patient presents with  . Dizziness     (Consider location/radiation/quality/duration/timing/severity/associated sxs/prior Treatment) HPI   [name] is a 81 y.o. female with past medical history significant for non-insulin-dependent diabetes, hypertension complaining of sensation that her head is swollen onset several hours ago, states it is intermittent and consistent with prior episodes of hyponatremia. Her daughter notes that she's had some difficulty with weakness and stumbled while walking down the stairs earlier today. She did not fall, she sat down on the steps. Patient denies vertiginous sensation of room spinning. She states that she took meclizine which normally helps her but did not alleviate her symptoms today. Patient denies headache, change in vision, dysarthria, nausea, vomiting, chest pain, shortness of breath, unilateral weakness.  Past Medical History  Diagnosis Date  . Dyspepsia and other specified disorders of function of stomach   . Chronic kidney disease, stage I   . Heat stroke and sunstroke     pt states she doesn't remember this -04/30/12  . Other and unspecified hyperlipidemia   . Gout, unspecified   . Type II or unspecified type diabetes mellitus without mention of complication, not stated as uncontrolled   . Unspecified essential hypertension   . Hemorrhoid    Past Surgical History  Procedure Laterality Date  . Colon surgery    . Tonsillectomy     Family History  Problem Relation Age of Onset  . Diabetes Sister   . Breast cancer Sister   . Coronary artery disease Neg Hx   . Heart attack Neg Hx   . Cancer Sister     breast- recovered  . Stroke Son   . Hypertension Son   . Heart disease Mother   . Gout Father    History  Substance Use Topics  . Smoking status: Never Smoker   . Smokeless tobacco: Never  Used  . Alcohol Use: No   OB History    No data available     Review of Systems  10 systems reviewed and found to be negative, except as noted in the HPI.   Allergies  Ciprofloxacin and Diltiazem hcl  Home Medications   Prior to Admission medications   Medication Sig Start Date End Date Taking? Authorizing Provider  ALPRAZolam Prudy Feeler) 0.5 MG tablet Take 0.25 mg by mouth daily as needed for anxiety (anxiety).    Yes Historical Provider, MD  amLODipine (NORVASC) 10 MG tablet Take 1 tablet (10 mg total) by mouth daily. 11/17/14  Yes Etta Grandchild, MD  bumetanide (BUMEX) 2 MG tablet Take 2 mg by mouth daily.   Yes Historical Provider, MD  bumetanide (BUMEX) 2 MG tablet TAKE 1 TABLET (2 MG TOTAL) BY MOUTH DAILY. 11/15/14  Yes Corwin Levins, MD  cloNIDine (CATAPRES) 0.1 MG tablet Take 0.1 mg by mouth 2 (two) times daily.   Yes Historical Provider, MD  diclofenac (VOLTAREN) 50 MG EC tablet Take 50 mg by mouth 2 (two) times daily.   Yes Historical Provider, MD  glimepiride (AMARYL) 4 MG tablet Take 4 mg by mouth 2 (two) times daily.   Yes Historical Provider, MD  meclizine (ANTIVERT) 12.5 MG tablet Take 6.25 mg by mouth at bedtime as needed for dizziness (dizzines).    Yes Historical Provider, MD  meloxicam (MOBIC) 7.5 MG tablet Take 7.5 mg by mouth daily  as needed for pain (pain).  04/19/14  Yes Historical Provider, MD  metoprolol tartrate (LOPRESSOR) 25 MG tablet Take 25 mg by mouth 2 (two) times daily.   Yes Historical Provider, MD  pioglitazone (ACTOS) 30 MG tablet Take 30 mg by mouth 2 (two) times daily.  04/19/14  Yes Historical Provider, MD  donepezil (ARICEPT) 5 MG tablet Take 5 mg by mouth daily. 03/24/14   Historical Provider, MD  losartan (COZAAR) 100 MG tablet Take 1 tablet (100 mg total) by mouth daily. Patient not taking: Reported on 03/05/2015 07/14/14   Corwin Levins, MD  promethazine (PHENERGAN) 12.5 MG tablet Take 12.5 mg by mouth every 6 (six) hours as needed for nausea or vomiting.     Historical Provider, MD   BP 162/82 mmHg  Pulse 58  Temp(Src) 97.5 F (36.4 C) (Oral)  Resp 10  SpO2 98% Physical Exam  Constitutional: She is oriented to person, place, and time. She appears well-developed and well-nourished. No distress.  HENT:  Head: Normocephalic and atraumatic.  Mouth/Throat: Oropharynx is clear and moist.  Eyes: Conjunctivae and EOM are normal. Pupils are equal, round, and reactive to light.  Neck: Normal range of motion.  Cardiovascular: Normal rate, regular rhythm and intact distal pulses.   Pulmonary/Chest: Effort normal and breath sounds normal.  Abdominal: Soft. Bowel sounds are normal. There is no tenderness.  Musculoskeletal: Normal range of motion.  Neurological: She is alert and oriented to person, place, and time.  II-Visual fields grossly intact. III/IV/VI-Extraocular movements intact.  Pupils reactive bilaterally. V/VII-Smile symmetric, equal eyebrow raise,   facial sensation intact VIII- Hearing grossly intact IX/X-Normal gag XI-bilateral shoulder shrug XII-midline tongue extension Motor: 5/5 bilaterally with normal tone and bulk Cerebellar: Normal finger-to-nose    Ambulates independently with a coordinated in nonantalgic gait.   Skin: She is not diaphoretic.  Psychiatric: She has a normal mood and affect.  Nursing note and vitals reviewed.   ED Course  Procedures (including critical care time) Labs Review Labs Reviewed  BASIC METABOLIC PANEL - Abnormal; Notable for the following:    Sodium 127 (*)    Chloride 92 (*)    Glucose, Bld 176 (*)    BUN 27 (*)    Creatinine, Ser 1.58 (*)    GFR calc non Af Amer 28 (*)    GFR calc Af Amer 33 (*)    All other components within normal limits  HEPATIC FUNCTION PANEL - Abnormal; Notable for the following:    Total Protein 8.5 (*)    All other components within normal limits  URINALYSIS, ROUTINE W REFLEX MICROSCOPIC - Abnormal; Notable for the following:    Specific Gravity, Urine <1.005  (*)    Protein, ur 100 (*)    Leukocytes, UA SMALL (*)    All other components within normal limits  CBG MONITORING, ED - Abnormal; Notable for the following:    Glucose-Capillary 172 (*)    All other components within normal limits  CBC  TROPONIN I  URINE MICROSCOPIC-ADD ON  CBG MONITORING, ED    Imaging Review Ct Head Wo Contrast  03/05/2015   CLINICAL DATA:  Current history of vertigo for which the patient is being actively treated. Acute onset of dizziness and generalized weakness earlier today. Similar episode approximately 1 year ago at which time the patient was diagnosed with hyponatremia.  EXAM: CT HEAD WITHOUT CONTRAST  TECHNIQUE: Contiguous axial images were obtained from the base of the skull through the vertex without intravenous contrast.  COMPARISON:  04/30/2012.  FINDINGS: Ventricular system normal in size and appearance for age. Mild age-appropriate cortical atrophy. No mass lesion. No midline shift. No acute hemorrhage or hematoma. No extra-axial fluid collections. No evidence of acute infarction. No significant interval change.  Hyperostosis frontalis interna, unchanged. Visualized paranasal sinuses, bilateral mastoid air cells and bilateral middle ear cavities well-aerated. Bilateral carotid siphon and vertebral artery atherosclerosis.  IMPRESSION: No acute or significant intracranial abnormality.   Electronically Signed   By: Hulan Saashomas  Lawrence M.D.   On: 03/05/2015 17:24     EKG Interpretation   Date/Time:  Saturday March 05 2015 16:05:38 EDT Ventricular Rate:  62 PR Interval:  306 QRS Duration: 93 QT Interval:  478 QTC Calculation: 485 R Axis:   -58 Text Interpretation:  Sinus rhythm Prolonged PR interval Left anterior  fascicular block Abnormal R-wave progression, late transition Probable  left ventricular hypertrophy Borderline prolonged QT interval Baseline  wander in lead(s) II III aVL aVF V1 No significant change from prior  Confirmed by Gwendolyn GrantWALDEN  MD, BLAIR  ([id number]) on 03/05/2015 6:16:23 PM      MDM   Final diagnoses:  Hyponatremia    Filed Vitals:   03/05/15 1534 03/05/15 1845  BP: 163/69 162/82  Pulse: 61 58  Temp: 97.5 F (36.4 C)   TempSrc: Oral Other (Comment)  Resp: 16 10  SpO2: 98% 98%    Medications  sodium chloride 0.9 % bolus 1,000 mL (0 mLs Intravenous Stopped 03/05/15 2011)    [name] is a pleasant 81 y.o. female presenting with sensation that her head was swollen states that this is what she feels like when she is hyponatremic. Cause of her hyponatremia has not been determined. Patient also had a little difficulty walking down the steps this morning and she slipped there was no fall or head trauma. Patient's sodium was found to be 127, patient will be given fluid bolus. Creatinine is 1.58 which is not atypical for her. Patient's neuro exam is nonfocal and she ambulates independently without difficulty. Head CT is negative. Patient will follow closely with her primary care physician Dr. Evlyn KannerSouth.  This is a shared visit with the attending physician who personally evaluated the patient and agrees with the care plan.   Evaluation does not show pathology that would require ongoing emergent intervention or inpatient treatment. Pt is hemodynamically stable and mentating appropriately. Discussed findings and plan with patient/guardian, who agrees with care plan. All questions answered. Return precautions discussed and outpatient follow up given.     Wynetta Emeryicole Aracelli Woloszyn, PA-C 03/05/15 2318  Elwin MochaBlair Walden, MD 03/05/15 [phone]

## 2015-03-05 NOTE — ED Notes (Signed)
Patient transported to CT 

## 2015-03-18 ENCOUNTER — Emergency Department (HOSPITAL_COMMUNITY): Payer: Medicare Other

## 2015-03-18 ENCOUNTER — Emergency Department (HOSPITAL_COMMUNITY)
Admission: EM | Admit: 2015-03-18 | Discharge: 2015-03-18 | Disposition: A | Discharge status: Home / Self Care | Payer: Medicare Other | Attending: Emergency Medicine | Admitting: Emergency Medicine

## 2015-03-18 ENCOUNTER — Encounter (HOSPITAL_COMMUNITY): Payer: Self-pay | Admitting: Emergency Medicine

## 2015-03-18 DIAGNOSIS — Z79899 Other long term (current) drug therapy: Secondary | ICD-10-CM | POA: Insufficient documentation

## 2015-03-18 DIAGNOSIS — R42 Dizziness and giddiness: Secondary | ICD-10-CM | POA: Diagnosis not present

## 2015-03-18 DIAGNOSIS — Z8673 Personal history of transient ischemic attack (TIA), and cerebral infarction without residual deficits: Secondary | ICD-10-CM | POA: Diagnosis not present

## 2015-03-18 DIAGNOSIS — R5383 Other fatigue: Secondary | ICD-10-CM | POA: Diagnosis present

## 2015-03-18 DIAGNOSIS — N181 Chronic kidney disease, stage 1: Secondary | ICD-10-CM | POA: Diagnosis not present

## 2015-03-18 DIAGNOSIS — Z8719 Personal history of other diseases of the digestive system: Secondary | ICD-10-CM | POA: Insufficient documentation

## 2015-03-18 DIAGNOSIS — I129 Hypertensive chronic kidney disease with stage 1 through stage 4 chronic kidney disease, or unspecified chronic kidney disease: Secondary | ICD-10-CM | POA: Insufficient documentation

## 2015-03-18 DIAGNOSIS — Z791 Long term (current) use of non-steroidal anti-inflammatories (NSAID): Secondary | ICD-10-CM | POA: Diagnosis not present

## 2015-03-18 DIAGNOSIS — R531 Weakness: Secondary | ICD-10-CM | POA: Diagnosis not present

## 2015-03-18 DIAGNOSIS — H538 Other visual disturbances: Secondary | ICD-10-CM | POA: Insufficient documentation

## 2015-03-18 DIAGNOSIS — E119 Type 2 diabetes mellitus without complications: Secondary | ICD-10-CM | POA: Diagnosis not present

## 2015-03-18 LAB — CBC
HCT: 35.7 % — ABNORMAL LOW (ref 36.0–46.0)
Hemoglobin: 12.1 g/dL (ref 12.0–15.0)
MCH: 30.6 pg (ref 26.0–34.0)
MCHC: 33.9 g/dL (ref 30.0–36.0)
MCV: 90.2 fL (ref 78.0–100.0)
Platelets: 338 10*3/uL (ref 150–400)
RBC: 3.96 MIL/uL (ref 3.87–5.11)
RDW: 13.2 % (ref 11.5–15.5)
WBC: 6.3 10*3/uL (ref 4.0–10.5)

## 2015-03-18 LAB — HEPATIC FUNCTION PANEL
ALT: 17 U/L (ref 14–54)
AST: 17 U/L (ref 15–41)
Albumin: 3.9 g/dL (ref 3.5–5.0)
Alkaline Phosphatase: 122 U/L (ref 38–126)
BILIRUBIN TOTAL: 0.3 mg/dL (ref 0.3–1.2)
Bilirubin, Direct: <0.1 mg/dL — ABNORMAL LOW (ref 0.1–0.5)
TOTAL PROTEIN: 7.5 g/dL (ref 6.5–8.1)

## 2015-03-18 LAB — BASIC METABOLIC PANEL
ANION GAP: 5 (ref 5–15)
BUN: 38 mg/dL — ABNORMAL HIGH (ref 6–20)
CHLORIDE: 97 mmol/L — AB (ref 101–111)
CO2: 28 mmol/L (ref 22–32)
Calcium: 9.1 mg/dL (ref 8.9–10.3)
Creatinine, Ser: 1.74 mg/dL — ABNORMAL HIGH (ref 0.44–1.00)
GFR, EST AFRICAN AMERICAN: 29 mL/min — AB (ref >60)
GFR, EST NON AFRICAN AMERICAN: 25 mL/min — AB (ref >60)
Glucose, Bld: 164 mg/dL — ABNORMAL HIGH (ref 70–99)
Potassium: 5.2 mmol/L — ABNORMAL HIGH (ref 3.5–5.1)
Sodium: 130 mmol/L — ABNORMAL LOW (ref 135–145)

## 2015-03-18 LAB — TROPONIN I: Troponin I: <0.03 ng/mL (ref <0.031)

## 2015-03-18 MED ORDER — SODIUM CHLORIDE 0.9 % IV BOLUS (SEPSIS)
500.0000 mL | Freq: Once | INTRAVENOUS | Status: AC
  Administered 2015-03-18: 500 mL via INTRAVENOUS

## 2015-03-18 NOTE — ED Notes (Addendum)
Pt reports she woke up with generalized weakness, bilateral leg weakness and blurred vision. Pt then took a nap and blurred vision got worse; vision improved prior to arrival. Pt alert and oriented. Pt reports she has had issues with her sodium recently.

## 2015-03-18 NOTE — Discharge Instructions (Signed)
Follow up with your md next week.  Rest this weekend.  Drink plenty of fluids and return if problems

## 2015-03-18 NOTE — ED Provider Notes (Signed)
CSN: [id number]     Arrival date & time 03/18/15  1512 History   First MD Initiated Contact with Patient 03/18/15 1702     Chief Complaint  Patient presents with  . Blurred Vision  . Fatigue     (Consider location/radiation/quality/duration/timing/severity/associated sxs/prior Treatment) Patient is a 81 y.o. female presenting with weakness. The history is provided by the patient (pt had some blurred vission and weakness earlier today.  now resolved).  Weakness This is a new problem. The current episode started 1 to 2 hours ago. The problem occurs rarely. The problem has been resolved. Pertinent negatives include no chest pain, no abdominal pain and no headaches. Nothing aggravates the symptoms. Nothing relieves the symptoms.    Past Medical History  Diagnosis Date  . Dyspepsia and other specified disorders of function of stomach   . Chronic kidney disease, stage I   . Heat stroke and sunstroke     pt states she doesn't remember this -04/30/12  . Other and unspecified hyperlipidemia   . Gout, unspecified   . Type II or unspecified type diabetes mellitus without mention of complication, not stated as uncontrolled   . Unspecified essential hypertension   . Hemorrhoid    Past Surgical History  Procedure Laterality Date  . Colon surgery    . Tonsillectomy     Family History  Problem Relation Age of Onset  . Diabetes Sister   . Breast cancer Sister   . Coronary artery disease Neg Hx   . Heart attack Neg Hx   . Cancer Sister     breast- recovered  . Stroke Son   . Hypertension Son   . Heart disease Mother   . Gout Father    History  Substance Use Topics  . Smoking status: Never Smoker   . Smokeless tobacco: Never Used  . Alcohol Use: No   OB History    No data available     Review of Systems  Constitutional: Negative for appetite change and fatigue.  HENT: Negative for congestion, ear discharge and sinus pressure.   Eyes: Negative for discharge.       Blurred  vision   Respiratory: Negative for cough.   Cardiovascular: Negative for chest pain.  Gastrointestinal: Negative for abdominal pain and diarrhea.  Genitourinary: Negative for frequency and hematuria.  Musculoskeletal: Negative for back pain.  Skin: Negative for rash.  Neurological: Positive for weakness. Negative for seizures and headaches.  Psychiatric/Behavioral: Negative for hallucinations.      Allergies  Ciprofloxacin and Diltiazem hcl  Home Medications   Prior to Admission medications   Medication Sig Start Date End Date Taking? Authorizing Provider  ALPRAZolam Prudy Feeler(XANAX) 0.5 MG tablet Take 0.25 mg by mouth daily as needed for anxiety (anxiety).     Historical Provider, MD  amLODipine (NORVASC) 10 MG tablet Take 1 tablet (10 mg total) by mouth daily. 11/17/14   Etta Grandchildhomas L Jones, MD  bumetanide (BUMEX) 2 MG tablet Take 2 mg by mouth daily.    Historical Provider, MD  bumetanide (BUMEX) 2 MG tablet TAKE 1 TABLET (2 MG TOTAL) BY MOUTH DAILY. 11/15/14   Corwin LevinsJames W John, MD  cloNIDine (CATAPRES) 0.1 MG tablet Take 0.1 mg by mouth 2 (two) times daily.    Historical Provider, MD  diclofenac (VOLTAREN) 50 MG EC tablet Take 50 mg by mouth 2 (two) times daily.    Historical Provider, MD  donepezil (ARICEPT) 5 MG tablet Take 5 mg by mouth daily. 03/24/14   Historical  Provider, MD  glimepiride (AMARYL) 4 MG tablet Take 4 mg by mouth 2 (two) times daily.    Historical Provider, MD  losartan (COZAAR) 100 MG tablet Take 1 tablet (100 mg total) by mouth daily. Patient not taking: Reported on 03/05/2015 07/14/14   Corwin LevinsJames W John, MD  meclizine (ANTIVERT) 12.5 MG tablet Take 6.25 mg by mouth at bedtime as needed for dizziness (dizzines).     Historical Provider, MD  meloxicam (MOBIC) 7.5 MG tablet Take 7.5 mg by mouth daily as needed for pain (pain).  04/19/14   Historical Provider, MD  metoprolol tartrate (LOPRESSOR) 25 MG tablet Take 25 mg by mouth 2 (two) times daily.    Historical Provider, MD  pioglitazone  (ACTOS) 30 MG tablet Take 30 mg by mouth 2 (two) times daily.  04/19/14   Historical Provider, MD  promethazine (PHENERGAN) 12.5 MG tablet Take 12.5 mg by mouth every 6 (six) hours as needed for nausea or vomiting.    Historical Provider, MD   BP 155/80 mmHg  Pulse 56  Temp(Src) 98.5 F (36.9 C) (Oral)  Resp 16  SpO2 100% Physical Exam  Constitutional: She is oriented to person, place, and time. She appears well-developed.  HENT:  Head: Normocephalic.  Eyes: Conjunctivae and EOM are normal. No scleral icterus.  Neck: Neck supple. No thyromegaly present.  Cardiovascular: Normal rate and regular rhythm.  Exam reveals no gallop and no friction rub.   No murmur heard. Pulmonary/Chest: No stridor. She has no wheezes. She has no rales. She exhibits no tenderness.  Abdominal: She exhibits no distension. There is no tenderness. There is no rebound.  Musculoskeletal: Normal range of motion. She exhibits no edema.  Lymphadenopathy:    She has no cervical adenopathy.  Neurological: She is oriented to person, place, and time. She exhibits normal muscle tone. Coordination normal.  Skin: No rash noted. No erythema.  Psychiatric: She has a normal mood and affect. Her behavior is normal.    ED Course  Procedures (including critical care time) Labs Review Labs Reviewed  CBC - Abnormal; Notable for the following:    HCT 35.7 (*)    All other components within normal limits  BASIC METABOLIC PANEL - Abnormal; Notable for the following:    Sodium 130 (*)    Potassium 5.2 (*)    Chloride 97 (*)    Glucose, Bld 164 (*)    BUN 38 (*)    Creatinine, Ser 1.74 (*)    GFR calc non Af Amer 25 (*)    GFR calc Af Amer 29 (*)    All other components within normal limits  HEPATIC FUNCTION PANEL - Abnormal; Notable for the following:    Bilirubin, Direct <0.1 (*)    All other components within normal limits  TROPONIN I    Imaging Review Dg Chest 2 View  03/18/2015   CLINICAL DATA:  Patient states she  woke up this morning with weakness all over and blurred vision. Patient denies and chest pain, cough or SOB at this time. Patient has had no surgeries on the heart or lungs. Patient is a nonsmoker. Patient has no hx of any heart or lungs conditions.  EXAM: CHEST  2 VIEW  COMPARISON:  05/01/2012  FINDINGS: Mild enlargement of the cardiac silhouette. No mediastinal or hilar masses or evidence of adenopathy.  Clear lungs.  No pleural effusion or pneumothorax.  Bony thorax is demineralized but grossly intact.  IMPRESSION: No acute cardiopulmonary disease.   Electronically Signed  By: Amie Portland M.D.   On: 03/18/2015 18:25   Ct Head Wo Contrast  03/18/2015   CLINICAL DATA:  Pt reports she woke up with generalized weakness, bilateral leg weakness and blurred vision; h/o HTN, diabetes  EXAM: CT HEAD WITHOUT CONTRAST  TECHNIQUE: Contiguous axial images were obtained from the base of the skull through the vertex without intravenous contrast.  COMPARISON:  03/05/2015  FINDINGS: Ventricles normal configuration. There is ventricular and sulcal enlargement reflecting age related volume loss. No hydrocephalus.  There are no parenchymal masses or mass effect. Mild periventricular white matter hypoattenuation is noted consistent with chronic microvascular ischemic change.  There is no evidence of a cortical infarct.  There are no extra-axial masses or abnormal fluid collections.  There is no intracranial hemorrhage.  Visualized sinuses and mastoid air cells are clear. No skull lesion.  IMPRESSION: 1. No acute intracranial abnormalities. 2. Age related volume loss. Mild chronic microvascular ischemic change.   Electronically Signed   By: Amie Portland M.D.   On: 03/18/2015 18:23     EKG Interpretation   Date/Time:  Friday Mar 18 2015 17:36:51 EDT Ventricular Rate:  58 PR Interval:  337 QRS Duration: 97 QT Interval:  506 QTC Calculation: 497 R Axis:   -51 Text Interpretation:  Sinus rhythm Prolonged PR interval  Left anterior  fascicular block Abnormal R-wave progression, late transition Probable  left ventricular hypertrophy Borderline prolonged QT interval Baseline  wander in lead(s) V2 V3 Confirmed by Roderick Calo  MD, Lyndsi Altic [phone]) on  03/18/2015 6:59:41 PM      MDM   Final diagnoses:  Dizziness  Weakness    Blurred vision and weakness,  resolved    Bethann Berkshire, MD 03/18/15 1901

## 2015-03-18 NOTE — ED Notes (Signed)
Called lab, was able to add on hepatic panel and troponin.

## 2015-10-18 ENCOUNTER — Other Ambulatory Visit: Payer: Self-pay

## 2015-10-18 MED ORDER — AMLODIPINE BESYLATE 10 MG PO TABS: 10.0000 mg | ORAL_TABLET | Freq: Every day | ORAL | Status: AC

## 2016-07-01 ENCOUNTER — Other Ambulatory Visit: Payer: Self-pay | Admitting: Internal Medicine

## 2016-08-15 ENCOUNTER — Other Ambulatory Visit: Payer: Self-pay | Admitting: Internal Medicine

## 2018-01-01 ENCOUNTER — Other Ambulatory Visit: Payer: Self-pay

## 2018-01-01 ENCOUNTER — Observation Stay (HOSPITAL_COMMUNITY)
Admission: EM | Admit: 2018-01-01 | Discharge: 2018-01-04 | Disposition: A | Discharge status: Home / Self Care | Payer: Medicare Other | Attending: Internal Medicine | Admitting: Internal Medicine

## 2018-01-01 ENCOUNTER — Encounter (HOSPITAL_COMMUNITY): Payer: Self-pay | Admitting: Emergency Medicine

## 2018-01-01 ENCOUNTER — Observation Stay (HOSPITAL_COMMUNITY): Payer: Medicare Other

## 2018-01-01 DIAGNOSIS — Z791 Long term (current) use of non-steroidal anti-inflammatories (NSAID): Secondary | ICD-10-CM | POA: Diagnosis not present

## 2018-01-01 DIAGNOSIS — I5032 Chronic diastolic (congestive) heart failure: Secondary | ICD-10-CM | POA: Insufficient documentation

## 2018-01-01 DIAGNOSIS — Z7984 Long term (current) use of oral hypoglycemic drugs: Secondary | ICD-10-CM | POA: Diagnosis not present

## 2018-01-01 DIAGNOSIS — I130 Hypertensive heart and chronic kidney disease with heart failure and stage 1 through stage 4 chronic kidney disease, or unspecified chronic kidney disease: Secondary | ICD-10-CM | POA: Diagnosis not present

## 2018-01-01 DIAGNOSIS — K3184 Gastroparesis: Secondary | ICD-10-CM | POA: Insufficient documentation

## 2018-01-01 DIAGNOSIS — Z8249 Family history of ischemic heart disease and other diseases of the circulatory system: Secondary | ICD-10-CM | POA: Insufficient documentation

## 2018-01-01 DIAGNOSIS — N179 Acute kidney failure, unspecified: Secondary | ICD-10-CM | POA: Diagnosis not present

## 2018-01-01 DIAGNOSIS — I10 Essential (primary) hypertension: Secondary | ICD-10-CM | POA: Diagnosis present

## 2018-01-01 DIAGNOSIS — R110 Nausea: Secondary | ICD-10-CM

## 2018-01-01 DIAGNOSIS — E1169 Type 2 diabetes mellitus with other specified complication: Secondary | ICD-10-CM | POA: Diagnosis not present

## 2018-01-01 DIAGNOSIS — E1143 Type 2 diabetes mellitus with diabetic autonomic (poly)neuropathy: Secondary | ICD-10-CM | POA: Diagnosis not present

## 2018-01-01 DIAGNOSIS — Z794 Long term (current) use of insulin: Secondary | ICD-10-CM | POA: Diagnosis not present

## 2018-01-01 DIAGNOSIS — E785 Hyperlipidemia, unspecified: Secondary | ICD-10-CM | POA: Diagnosis not present

## 2018-01-01 DIAGNOSIS — E860 Dehydration: Secondary | ICD-10-CM

## 2018-01-01 DIAGNOSIS — Z79899 Other long term (current) drug therapy: Secondary | ICD-10-CM | POA: Diagnosis not present

## 2018-01-01 DIAGNOSIS — N390 Urinary tract infection, site not specified: Secondary | ICD-10-CM | POA: Diagnosis present

## 2018-01-01 DIAGNOSIS — Z833 Family history of diabetes mellitus: Secondary | ICD-10-CM | POA: Diagnosis not present

## 2018-01-01 DIAGNOSIS — R600 Localized edema: Secondary | ICD-10-CM | POA: Insufficient documentation

## 2018-01-01 DIAGNOSIS — N184 Chronic kidney disease, stage 4 (severe): Secondary | ICD-10-CM | POA: Insufficient documentation

## 2018-01-01 DIAGNOSIS — E1122 Type 2 diabetes mellitus with diabetic chronic kidney disease: Secondary | ICD-10-CM | POA: Insufficient documentation

## 2018-01-01 DIAGNOSIS — R0602 Shortness of breath: Secondary | ICD-10-CM

## 2018-01-01 DIAGNOSIS — R531 Weakness: Secondary | ICD-10-CM

## 2018-01-01 HISTORY — DX: Headache: R51

## 2018-01-01 HISTORY — DX: Unspecified osteoarthritis, unspecified site: M19.90

## 2018-01-01 HISTORY — DX: Headache, unspecified: R51.9

## 2018-01-01 LAB — URINALYSIS, ROUTINE W REFLEX MICROSCOPIC
Bilirubin Urine: NEGATIVE
Glucose, UA: 50 mg/dL — AB
Hgb urine dipstick: NEGATIVE
Ketones, ur: NEGATIVE mg/dL
Nitrite: NEGATIVE
Protein, ur: 100 mg/dL — AB
SPECIFIC GRAVITY, URINE: 1.008 (ref 1.005–1.030)
pH: 8 (ref 5.0–8.0)

## 2018-01-01 LAB — GLUCOSE, CAPILLARY: Glucose-Capillary: 181 mg/dL — ABNORMAL HIGH (ref 65–99)

## 2018-01-01 LAB — CBC
HEMATOCRIT: 34.1 % — AB (ref 36.0–46.0)
Hemoglobin: 11.3 g/dL — ABNORMAL LOW (ref 12.0–15.0)
MCH: 29.9 pg (ref 26.0–34.0)
MCHC: 33.1 g/dL (ref 30.0–36.0)
MCV: 90.2 fL (ref 78.0–100.0)
PLATELETS: 396 10*3/uL (ref 150–400)
RBC: 3.78 MIL/uL — ABNORMAL LOW (ref 3.87–5.11)
RDW: 14.1 % (ref 11.5–15.5)
WBC: 9.1 10*3/uL (ref 4.0–10.5)

## 2018-01-01 LAB — BRAIN NATRIURETIC PEPTIDE: B Natriuretic Peptide: 156 pg/mL — ABNORMAL HIGH (ref 0.0–100.0)

## 2018-01-01 LAB — COMPREHENSIVE METABOLIC PANEL
ALK PHOS: 115 U/L (ref 38–126)
ALT: 19 U/L (ref 14–54)
AST: 29 U/L (ref 15–41)
Albumin: 3.8 g/dL (ref 3.5–5.0)
Anion gap: 13 (ref 5–15)
BUN: 16 mg/dL (ref 6–20)
CO2: 22 mmol/L (ref 22–32)
Calcium: 9.5 mg/dL (ref 8.9–10.3)
Chloride: 99 mmol/L — ABNORMAL LOW (ref 101–111)
Creatinine, Ser: 2.29 mg/dL — ABNORMAL HIGH (ref 0.44–1.00)
GFR calc Af Amer: 21 mL/min — ABNORMAL LOW (ref >60)
GFR calc non Af Amer: 18 mL/min — ABNORMAL LOW (ref >60)
GLUCOSE: 118 mg/dL — AB (ref 65–99)
POTASSIUM: 4.9 mmol/L (ref 3.5–5.1)
Sodium: 134 mmol/L — ABNORMAL LOW (ref 135–145)
Total Bilirubin: 0.4 mg/dL (ref 0.3–1.2)
Total Protein: 7.9 g/dL (ref 6.5–8.1)

## 2018-01-01 LAB — TROPONIN I: Troponin I: <0.03 ng/mL (ref <0.03)

## 2018-01-01 LAB — I-STAT TROPONIN, ED: Troponin i, poc: 0.03 ng/mL (ref 0.00–0.08)

## 2018-01-01 LAB — HEMOGLOBIN A1C
HEMOGLOBIN A1C: 7.8 % — AB (ref 4.8–5.6)
MEAN PLASMA GLUCOSE: 177.16 mg/dL

## 2018-01-01 MED ORDER — PANTOPRAZOLE SODIUM 40 MG PO TBEC
40.0000 mg | DELAYED_RELEASE_TABLET | Freq: Every day | ORAL | Status: DC
  Administered 2018-01-01 – 2018-01-04 (×4): 40 mg via ORAL
  Filled 2018-01-01 (×4): qty 1

## 2018-01-01 MED ORDER — POLYETHYLENE GLYCOL 3350 17 G PO PACK: 17.0000 g | PACK | Freq: Every day | ORAL | Status: DC | PRN

## 2018-01-01 MED ORDER — SODIUM CHLORIDE 0.9 % IV BOLUS (SEPSIS)
1000.0000 mL | Freq: Once | INTRAVENOUS | Status: AC
  Administered 2018-01-01: 1000 mL via INTRAVENOUS

## 2018-01-01 MED ORDER — MECLIZINE HCL 12.5 MG PO TABS
6.2500 mg | ORAL_TABLET | Freq: Every evening | ORAL | Status: DC | PRN
  Filled 2018-01-01: qty 0.5

## 2018-01-01 MED ORDER — ACETAMINOPHEN 650 MG RE SUPP: 650.0000 mg | Freq: Four times a day (QID) | RECTAL | Status: DC | PRN

## 2018-01-01 MED ORDER — HEPARIN SODIUM (PORCINE) 5000 UNIT/ML IJ SOLN
5000.0000 [IU] | Freq: Three times a day (TID) | INTRAMUSCULAR | Status: DC
  Administered 2018-01-01 – 2018-01-04 (×8): 5000 [IU] via SUBCUTANEOUS
  Filled 2018-01-01 (×7): qty 1

## 2018-01-01 MED ORDER — INSULIN ASPART 100 UNIT/ML ~~LOC~~ SOLN
0.0000 [IU] | Freq: Three times a day (TID) | SUBCUTANEOUS | Status: DC
  Administered 2018-01-02: 2 [IU] via SUBCUTANEOUS
  Administered 2018-01-02: 1 [IU] via SUBCUTANEOUS
  Administered 2018-01-03 – 2018-01-04 (×4): 2 [IU] via SUBCUTANEOUS

## 2018-01-01 MED ORDER — ONDANSETRON HCL 4 MG/2ML IJ SOLN: 4.0000 mg | Freq: Four times a day (QID) | INTRAMUSCULAR | Status: DC | PRN

## 2018-01-01 MED ORDER — SODIUM CHLORIDE 0.9% FLUSH
3.0000 mL | Freq: Two times a day (BID) | INTRAVENOUS | Status: DC
  Administered 2018-01-02 – 2018-01-04 (×3): 3 mL via INTRAVENOUS

## 2018-01-01 MED ORDER — SODIUM CHLORIDE 0.9 % IV SOLN
1.0000 g | Freq: Once | INTRAVENOUS | Status: AC
  Administered 2018-01-01: 1 g via INTRAVENOUS
  Filled 2018-01-01: qty 10

## 2018-01-01 MED ORDER — ACETAMINOPHEN 325 MG PO TABS: 650.0000 mg | ORAL_TABLET | Freq: Four times a day (QID) | ORAL | Status: DC | PRN

## 2018-01-01 MED ORDER — ONDANSETRON HCL 4 MG PO TABS: 4.0000 mg | ORAL_TABLET | Freq: Four times a day (QID) | ORAL | Status: DC | PRN

## 2018-01-01 MED ORDER — TRAZODONE HCL 50 MG PO TABS: 25.0000 mg | ORAL_TABLET | Freq: Every evening | ORAL | Status: DC | PRN

## 2018-01-01 MED ORDER — SODIUM CHLORIDE 0.9 % IV SOLN
250.0000 mL | INTRAVENOUS | Status: DC | PRN
Start: 2018-01-01 — End: 2018-01-04

## 2018-01-01 MED ORDER — INSULIN ASPART 100 UNIT/ML ~~LOC~~ SOLN
0.0000 [IU] | Freq: Every day | SUBCUTANEOUS | Status: DC
  Administered 2018-01-03: 2 [IU] via SUBCUTANEOUS

## 2018-01-01 MED ORDER — SODIUM CHLORIDE 0.9% FLUSH
3.0000 mL | Freq: Two times a day (BID) | INTRAVENOUS | Status: DC
  Administered 2018-01-01 (×2): 3 mL via INTRAVENOUS

## 2018-01-01 MED ORDER — CLONIDINE HCL 0.1 MG PO TABS
0.1000 mg | ORAL_TABLET | Freq: Every day | ORAL | Status: DC
  Administered 2018-01-02 – 2018-01-04 (×3): 0.1 mg via ORAL
  Filled 2018-01-01 (×3): qty 1

## 2018-01-01 MED ORDER — CLONIDINE HCL 0.2 MG PO TABS
0.2000 mg | ORAL_TABLET | Freq: Every day | ORAL | Status: DC
  Administered 2018-01-01 – 2018-01-03 (×3): 0.2 mg via ORAL
  Filled 2018-01-01 (×3): qty 1

## 2018-01-01 MED ORDER — SODIUM CHLORIDE 0.9 % IV SOLN
INTRAVENOUS | Status: DC
  Administered 2018-01-01 – 2018-01-02 (×4): via INTRAVENOUS

## 2018-01-01 MED ORDER — SODIUM CHLORIDE 0.9% FLUSH
3.0000 mL | INTRAVENOUS | Status: DC | PRN
Start: 2018-01-01 — End: 2018-01-04

## 2018-01-01 MED ORDER — CLONIDINE HCL 0.1 MG PO TABS: 0.1000 mg | ORAL_TABLET | ORAL | Status: DC

## 2018-01-01 MED ORDER — ALPRAZOLAM 0.25 MG PO TABS: 0.2500 mg | ORAL_TABLET | Freq: Every day | ORAL | Status: DC | PRN

## 2018-01-01 MED ORDER — HYDRALAZINE HCL 25 MG PO TABS
25.0000 mg | ORAL_TABLET | Freq: Three times a day (TID) | ORAL | Status: DC
  Administered 2018-01-01 – 2018-01-04 (×8): 25 mg via ORAL
  Filled 2018-01-01 (×8): qty 1

## 2018-01-01 NOTE — ED Notes (Addendum)
Pt denies any pain or other s.s, only nausea. Pt family member stated she has a sick daughter in the hospital that she has been worried about that could be a factor in her nausea.

## 2018-01-01 NOTE — ED Provider Notes (Addendum)
[facility] EMERGENCY DEPARTMENT Provider Note   CSN: [id number] Arrival date & time: 01/01/18  1457     History   Chief Complaint Chief Complaint  Patient presents with  . Nausea    HPI [name] is a 84 y.o. female.  Patient c/o nausea onset today. Symptoms moderate, persistent, without specific exacerbating or alleviating factors. Denies vomiting or diarrhea. Relatively poor po intake today. Patient denies any pain. No headache. No chest pain. No abd pain. Denies cough or uri symptoms. No fever or chills. No dysuria or gu c/o. Denies change in meds or new meds. No trauma or fall. No syncope.    The history is provided by the patient.    Past Medical History:  Diagnosis Date  . Chronic kidney disease, stage I   . Dyspepsia and other specified disorders of function of stomach   . Gout, unspecified   . Heat stroke and sunstroke    pt states she doesn't remember this -04/30/12  . Hemorrhoid   . Other and unspecified hyperlipidemia   . Type II or unspecified type diabetes mellitus without mention of complication, not stated as uncontrolled   . Unspecified essential hypertension     Patient Active Problem List   Diagnosis Date Noted  . Hyponatremia 04/30/2012  . Routine general medical examination at a health care facility 05/09/2011  . GASTROPARESIS 07/05/2010  . INTERNAL HEMORRHOIDS WITHOUT MENTION COMP 05/29/2010  . DYSPEPSIA 03/02/2010  . CHRONIC KIDNEY DISEASE STAGE I 10/21/2009  . HEAT STROKE 06/07/2008  . DIABETES MELLITUS, TYPE II 07/22/2007  . HYPERLIPIDEMIA 07/22/2007  . GOUT 07/22/2007  . HYPERTENSION 07/22/2007    Past Surgical History:  Procedure Laterality Date  . COLON SURGERY    . TONSILLECTOMY      OB History    No data available       Home Medications    Prior to Admission medications   Medication Sig Start Date End Date Taking? Authorizing Provider  ALPRAZolam Prudy Feeler) 0.5 MG tablet Take 0.25 mg by mouth daily as  needed for anxiety (anxiety).     [provider]  amLODipine (NORVASC) 10 MG tablet Take 1 tablet (10 mg total) by mouth daily. 10/18/15   Etta Grandchild, MD  bumetanide (BUMEX) 2 MG tablet Take 2 mg by mouth daily.    [provider]  bumetanide (BUMEX) 2 MG tablet TAKE 1 TABLET (2 MG TOTAL) BY MOUTH DAILY. 11/15/14   Corwin Levins, MD  cloNIDine (CATAPRES) 0.1 MG tablet Take 0.1 mg by mouth 2 (two) times daily.    [provider]  diclofenac (VOLTAREN) 50 MG EC tablet Take 50 mg by mouth 2 (two) times daily.    [provider]  donepezil (ARICEPT) 5 MG tablet Take 5 mg by mouth daily. 03/24/14   [provider]  glimepiride (AMARYL) 4 MG tablet Take 4 mg by mouth 2 (two) times daily.    [provider]  losartan (COZAAR) 100 MG tablet Take 1 tablet (100 mg total) by mouth daily. Patient not taking: Reported on 03/05/2015 07/14/14   Corwin Levins, MD  meclizine (ANTIVERT) 12.5 MG tablet Take 6.25 mg by mouth at bedtime as needed for dizziness (dizzines).     [provider]  meloxicam (MOBIC) 7.5 MG tablet Take 7.5 mg by mouth daily as needed for pain (pain).  04/19/14   [provider]  metoprolol tartrate (LOPRESSOR) 25 MG tablet Take 25 mg by mouth 2 (two)  times daily.    [provider]  pioglitazone (ACTOS) 30 MG tablet Take 30 mg by mouth 2 (two) times daily.  04/19/14   [provider]  promethazine (PHENERGAN) 12.5 MG tablet Take 12.5 mg by mouth every 6 (six) hours as needed for nausea or vomiting.    [provider]  simvastatin (ZOCOR) 40 MG tablet Take 20 mg by mouth at bedtime.    01/29/12  [provider]    Family History Family History  Problem Relation Age of Onset  . Diabetes Sister   . Breast cancer Sister   . Stroke Son   . Hypertension Son   . Heart disease Mother   . Gout Father   . Cancer Sister        breast- recovered  . Coronary artery disease Neg Hx   . Heart  attack Neg Hx     Social History Social History   Tobacco Use  . Smoking status: Never Smoker  . Smokeless tobacco: Never Used  Substance Use Topics  . Alcohol use: No  . Drug use: No     Allergies   Ciprofloxacin and Diltiazem hcl   Review of Systems Review of Systems  Constitutional: Negative for fever.  HENT: Negative for sore throat.   Eyes: Negative for redness.  Respiratory: Negative for cough and shortness of breath.   Cardiovascular: Negative for chest pain.  Gastrointestinal: Positive for nausea. Negative for abdominal pain and vomiting.  Endocrine: Negative for polyuria.  Genitourinary: Negative for dysuria and flank pain.  Musculoskeletal: Negative for back pain and neck pain.  Skin: Negative for rash.  Neurological: Negative for headaches.  Hematological: Does not bruise/bleed easily.  Psychiatric/Behavioral: Negative for confusion.     Physical Exam Updated Vital Signs BP (!) 168/63 (BP Location: Right Arm)   Pulse 100   Temp 98.7 F (37.1 C) (Oral)   Resp 18   Ht 1.6 m (5\' 3" )   Wt 86.2 kg (190 lb)   SpO2 100%   BMI 33.66 kg/m   Physical Exam  Constitutional: She appears well-developed and well-nourished. No distress.  HENT:  Head: Atraumatic.  Mouth/Throat: Oropharynx is clear and moist.  Eyes: Conjunctivae are normal. No scleral icterus.  Neck: Neck supple. No tracheal deviation present. No thyromegaly present.  Cardiovascular: Normal rate, regular rhythm, normal heart sounds and intact distal pulses. Exam reveals no gallop and no friction rub.  No murmur heard. Pulmonary/Chest: Effort normal and breath sounds normal. No respiratory distress.  Abdominal: Soft. Normal appearance and bowel sounds are normal. She exhibits no distension. There is no tenderness.  Genitourinary:  Genitourinary Comments: No cva tenderness  Musculoskeletal: She exhibits no edema.  Neurological: She is alert.  Speech clear/fluent. Moves bil extremities  purposefully w good strength.   Skin: Skin is warm and dry. No rash noted. She is not diaphoretic.  Psychiatric: She has a normal mood and affect.  Nursing note and vitals reviewed.    ED Treatments / Results  Labs (all labs ordered are listed, but only abnormal results are displayed) Results for orders placed or performed during the hospital encounter of 01/01/18  Comprehensive metabolic panel  Result Value Ref Range   Sodium 134 (L) 135 - 145 mmol/L   Potassium 4.9 3.5 - 5.1 mmol/L   Chloride 99 (L) 101 - 111 mmol/L   CO2 22 22 - 32 mmol/L   Glucose, Bld 118 (H) 65 - 99 mg/dL   BUN 16 6 - 20 mg/dL  Creatinine, Ser 2.29 (H) 0.44 - 1.00 mg/dL   Calcium 9.5 8.9 - 16.110.3 mg/dL   Total Protein 7.9 6.5 - 8.1 g/dL   Albumin 3.8 3.5 - 5.0 g/dL   AST 29 15 - 41 U/L   ALT 19 14 - 54 U/L   Alkaline Phosphatase 115 38 - 126 U/L   Total Bilirubin 0.4 0.3 - 1.2 mg/dL   GFR calc non Af Amer 18 (L) >60 mL/min   GFR calc Af Amer 21 (L) >60 mL/min   Anion gap 13 5 - 15  CBC  Result Value Ref Range   WBC 9.1 4.0 - 10.5 K/uL   RBC 3.78 (L) 3.87 - 5.11 MIL/uL   Hemoglobin 11.3 (L) 12.0 - 15.0 g/dL   HCT 09.634.1 (L) 04.536.0 - 40.946.0 %   MCV 90.2 78.0 - 100.0 fL   MCH 29.9 26.0 - 34.0 pg   MCHC 33.1 30.0 - 36.0 g/dL   RDW 81.114.1 91.411.5 - 78.215.5 %   Platelets 396 150 - 400 K/uL  Urinalysis, Routine w reflex microscopic  Result Value Ref Range   Color, Urine YELLOW YELLOW   APPearance HAZY (A) CLEAR   Specific Gravity, Urine 1.008 1.005 - 1.030   pH 8.0 5.0 - 8.0   Glucose, UA 50 (A) NEGATIVE mg/dL   Hgb urine dipstick NEGATIVE NEGATIVE   Bilirubin Urine NEGATIVE NEGATIVE   Ketones, ur NEGATIVE NEGATIVE mg/dL   Protein, ur 956100 (A) NEGATIVE mg/dL   Nitrite NEGATIVE NEGATIVE   Leukocytes, UA LARGE (A) NEGATIVE   RBC / HPF 0-5 0 - 5 RBC/hpf   WBC, UA TOO NUMEROUS TO COUNT 0 - 5 WBC/hpf   Bacteria, UA RARE (A) NONE SEEN   Squamous Epithelial / LPF 0-5 (A) NONE SEEN   WBC Clumps PRESENT    Non  Squamous Epithelial 0-5 (A) NONE SEEN  I-stat troponin, ED  Result Value Ref Range   Troponin i, poc 0.03 0.00 - 0.08 ng/mL   Comment 3            EKG  EKG Interpretation  Date/Time:  Wednesday January 01 2018 15:52:22 EST Ventricular Rate:  98 PR Interval:  210 QRS Duration: 84 QT Interval:  358 QTC Calculation: 457 R Axis:   -67 Text Interpretation:  Sinus rhythm with 1st degree A-V block Left anterior fascicular block Confirmed by Cathren LaineSteinl, Leanna Hamid ([id number]) on 01/01/2018 5:09:45 PM       Radiology No results found.  Procedures Procedures (including critical care time)  Medications Ordered in ED Medications - No data to display   Initial Impression / Assessment and Plan / ED Course  I have reviewed the triage vital signs and the nursing notes.  Pertinent labs & imaging results that were available during my care of the patient were reviewed by me and considered in my medical decision making (see chart for details).  Iv ns. Labs.   Reviewed nursing notes and prior charts for additional history.   Ns bolus.  Labs reviewed - ua positive for uti. u culture sent.   Rocephin iv.  Medical service consulted for admission.    Final Clinical Impressions(s) / ED Diagnoses   Final diagnoses:  None    ED Discharge Orders    None           Cathren LaineSteinl, Keiandra Sullenger, MD 01/01/18 [phone]

## 2018-01-01 NOTE — H&P (Signed)
History and Physical  [name] GNF:[id number] DOB: 1926-02-11 DOA: 01/01/2018  Referring physician: ED PCP: Adrian PrinceSouth, Stephen, MD  Outpatient Specialists: Patient coming from: Home & is able to ambulate independently  Chief Complaint: "I have been feeling off for the past few days"  HPI: Norma LadeMarie Arts is a 84 y.o. female with medical history significant for HTN, DM type 2, Hx of gastroparesis, presents to the ED c/o of "feeling off" for the past few days. Pt initially reported to the ED docs that she was feeling nauseated without vomiting. Pt denies abdominal pain, chest pain, SOB, fever/chills, diarrhea, dizziness, dysuria, no focal neurologic deficit. Pt reported not drinking enough fluids and still compliant with meds. Pt reported seeing her PCP about 2 weeks ago and was told labs were stable. Of note, pt daughter reported pt might be worried/anxious as her sister/pt other daughter is in the ICU. Pt lives alone, ambulates unassisted, and able to prepare her meals.    ED Course: In the ED, pt was noted to have numerous WBC, leukocytes in U/A with AKI. Pt admitted for observation for possible UTI and AKI  Review of Systems: Review of systems are otherwise negative   Past Medical History:  Diagnosis Date  . Chronic kidney disease, stage I   . Dyspepsia and other specified disorders of function of stomach   . Gout, unspecified   . Heat stroke and sunstroke    pt states she doesn't remember this -04/30/12  . Hemorrhoid   . Other and unspecified hyperlipidemia   . Type II or unspecified type diabetes mellitus without mention of complication, not stated as uncontrolled   . Unspecified essential hypertension    Past Surgical History:  Procedure Laterality Date  . COLON SURGERY    . TONSILLECTOMY      Social History:  reports that  has never smoked. she has never used smokeless tobacco. She reports that she does not drink alcohol or use drugs.   Allergies  Allergen Reactions  .  Ciprofloxacin     Unknown  . Diltiazem Hcl     Unknown    Family History  Problem Relation Age of Onset  . Diabetes Sister   . Breast cancer Sister   . Stroke Son   . Hypertension Son   . Heart disease Mother   . Gout Father   . Cancer Sister        breast- recovered  . Coronary artery disease Neg Hx   . Heart attack Neg Hx       Prior to Admission medications   Medication Sig Start Date End Date Taking? Authorizing Provider  cloNIDine (CATAPRES) 0.1 MG tablet Take 0.1 mg by mouth See admin instructions. Take 0.1 mg in the morning and 0.2 mg in the evening   Yes [provider]  hydrALAZINE (APRESOLINE) 25 MG tablet Take 25 mg by mouth 3 (three) times daily. 12/13/17  Yes [provider]  ALPRAZolam Prudy Feeler(XANAX) 0.5 MG tablet Take 0.25 mg by mouth daily as needed for anxiety (anxiety).     [provider]  amLODipine (NORVASC) 10 MG tablet Take 1 tablet (10 mg total) by mouth daily. 10/18/15   Etta GrandchildJones, Thomas L, MD  bumetanide (BUMEX) 2 MG tablet TAKE 1 TABLET (2 MG TOTAL) BY MOUTH DAILY. Patient not taking: Reported on 01/01/2018 11/15/14   Corwin LevinsJohn, James W, MD  HUMALOG MIX 75/25 KWIKPEN (75-25) 100 UNIT/ML Kwikpen Inject 20 Units into the skin 2 (two) times daily. 11/27/17   [provider]  losartan (COZAAR) 100 MG tablet Take 1 tablet (100 mg total) by mouth daily. Patient not taking: Reported on 03/05/2015 07/14/14   Corwin Levins, MD  meclizine (ANTIVERT) 12.5 MG tablet Take 6.25 mg by mouth at bedtime as needed for dizziness (dizzines).     [provider]  omeprazole (PRILOSEC) 20 MG capsule Take 40 mg by mouth every morning. 10/06/17   [provider]  simvastatin (ZOCOR) 40 MG tablet Take 20 mg by mouth at bedtime.    01/29/12  [provider]    Physical Exam: BP (!) 164/73 (BP Location: Left Arm)   Pulse 88   Temp (!) 97.5 F (36.4 C) (Oral)   Resp 20   Ht 5\' 3"  (1.6 m)   Wt 86.2 kg (190 lb 0.6 oz)   SpO2 100%   BMI  33.66 kg/m   General:  NAD, alert, oriented  Eyes: No pallor, jaundice   ENT: Ears/nose/throat normal Neck: No enlargement noted  Cardiovascular: S1, S2 present, RRR Respiratory: CTA  Abdomen: Soft, non-tender, ND, BS+  Skin: Normal Musculoskeletal: Trace pedal edema bilaterally Psychiatric: Normal mood Neurologic: No focal neurologic deficit          Labs on Admission:  Basic Metabolic Panel: Recent Labs  Lab 01/01/18 1543  NA 134*  K 4.9  CL 99*  CO2 22  GLUCOSE 118*  BUN 16  CREATININE 2.29*  CALCIUM 9.5   Liver Function Tests: Recent Labs  Lab 01/01/18 1543  AST 29  ALT 19  ALKPHOS 115  BILITOT 0.4  PROT 7.9  ALBUMIN 3.8   No results for input(s): LIPASE, AMYLASE in the last 168 hours. No results for input(s): AMMONIA in the last 168 hours. CBC: Recent Labs  Lab 01/01/18 1543  WBC 9.1  HGB 11.3*  HCT 34.1*  MCV 90.2  PLT 396   Cardiac Enzymes: Recent Labs  Lab 01/01/18 1901  TROPONINI <0.03    BNP (last 3 results) Recent Labs    01/01/18 1901  BNP 156.0*    ProBNP (last 3 results) No results for input(s): PROBNP in the last 8760 hours.  CBG: Recent Labs  Lab 01/01/18 2156  GLUCAP 181*    Radiological Exams on Admission: No results found.  EKG: Independently reviewed  Assessment/Plan Present on Admission: . Acute kidney injury (HCC) . Acute lower UTI . Essential hypertension . Type 2 diabetes mellitus with other specified complication (HCC) . Acute renal failure (ARF) (HCC)  Principal Problem:   Acute kidney injury (HCC) Active Problems:   Type 2 diabetes mellitus with other specified complication (HCC)   Essential hypertension   Acute lower UTI   Acute renal failure (ARF) (HCC)  AKI on Stage IV CKD Baseline Cr around 1.74 2.29 on admission Likely 2/2 poor oral intake Vs medication induced IVF Strict I & O, avoid nephrotoxics Renal USS, bladder scan  Possible UTI U/A showed large leukocytes, TNTC WBC with  clumps UC pending In the ED, started on IV ceftriaxone, will continue pending UC  HTN Stable Continue home clonidine, hydralazine  Type 2 DM A1c 7.8 SSI Held humalog   DVT prophylaxis: Heparin   Code Status: Full  Family Communication: Spoke with daughter and son  Disposition Plan: TBD  Consults called: None  Admission status: Observation     Briant Cedar MD [facility]  If 7PM-7AM, please contact night-coverage [email or web address] Password [facility]  01/01/2018, 10:29 PM

## 2018-01-01 NOTE — ED Triage Notes (Signed)
Pt to ED with c/o nausea, onset earlier today.  Pt denies vomiting or diarrhea.  Pt denies any pain or urinary symptoms.

## 2018-01-01 NOTE — ED Notes (Signed)
Ordered HHealthy tray

## 2018-01-01 NOTE — ED Notes (Signed)
Pt in hallway eating something given to her by family.

## 2018-01-02 ENCOUNTER — Observation Stay (HOSPITAL_BASED_OUTPATIENT_CLINIC_OR_DEPARTMENT_OTHER): Payer: Medicare Other

## 2018-01-02 ENCOUNTER — Observation Stay (HOSPITAL_COMMUNITY): Payer: Medicare Other

## 2018-01-02 DIAGNOSIS — I361 Nonrheumatic tricuspid (valve) insufficiency: Secondary | ICD-10-CM | POA: Diagnosis not present

## 2018-01-02 DIAGNOSIS — E1169 Type 2 diabetes mellitus with other specified complication: Secondary | ICD-10-CM | POA: Diagnosis not present

## 2018-01-02 DIAGNOSIS — N179 Acute kidney failure, unspecified: Secondary | ICD-10-CM | POA: Diagnosis not present

## 2018-01-02 DIAGNOSIS — I10 Essential (primary) hypertension: Secondary | ICD-10-CM | POA: Diagnosis not present

## 2018-01-02 DIAGNOSIS — E860 Dehydration: Secondary | ICD-10-CM

## 2018-01-02 DIAGNOSIS — N390 Urinary tract infection, site not specified: Secondary | ICD-10-CM | POA: Diagnosis not present

## 2018-01-02 LAB — COMPREHENSIVE METABOLIC PANEL
ALT: 16 U/L (ref 14–54)
ANION GAP: 9 (ref 5–15)
AST: 25 U/L (ref 15–41)
Albumin: 3 g/dL — ABNORMAL LOW (ref 3.5–5.0)
Alkaline Phosphatase: 95 U/L (ref 38–126)
BUN: 15 mg/dL (ref 6–20)
CHLORIDE: 104 mmol/L (ref 101–111)
CO2: 23 mmol/L (ref 22–32)
Calcium: 8.7 mg/dL — ABNORMAL LOW (ref 8.9–10.3)
Creatinine, Ser: 2.26 mg/dL — ABNORMAL HIGH (ref 0.44–1.00)
GFR, EST AFRICAN AMERICAN: 21 mL/min — AB (ref >60)
GFR, EST NON AFRICAN AMERICAN: 18 mL/min — AB (ref >60)
Glucose, Bld: 121 mg/dL — ABNORMAL HIGH (ref 65–99)
POTASSIUM: 5 mmol/L (ref 3.5–5.1)
Sodium: 136 mmol/L (ref 135–145)
Total Bilirubin: 0.5 mg/dL (ref 0.3–1.2)
Total Protein: 6.5 g/dL (ref 6.5–8.1)

## 2018-01-02 LAB — CBC
HEMATOCRIT: 30.6 % — AB (ref 36.0–46.0)
HEMOGLOBIN: 9.9 g/dL — AB (ref 12.0–15.0)
MCH: 29.4 pg (ref 26.0–34.0)
MCHC: 32.4 g/dL (ref 30.0–36.0)
MCV: 90.8 fL (ref 78.0–100.0)
Platelets: 309 10*3/uL (ref 150–400)
RBC: 3.37 MIL/uL — AB (ref 3.87–5.11)
RDW: 14.2 % (ref 11.5–15.5)
WBC: 5.2 10*3/uL (ref 4.0–10.5)

## 2018-01-02 LAB — ECHOCARDIOGRAM COMPLETE
HEIGHTINCHES: 63 in
WEIGHTICAEL: 3040.58 [oz_av]

## 2018-01-02 LAB — TROPONIN I
TROPONIN I: 0.03 ng/mL — AB (ref <0.03)
Troponin I: 0.03 ng/mL (ref <0.03)

## 2018-01-02 LAB — GLUCOSE, CAPILLARY
GLUCOSE-CAPILLARY: 118 mg/dL — AB (ref 65–99)
GLUCOSE-CAPILLARY: 162 mg/dL — AB (ref 65–99)
Glucose-Capillary: 194 mg/dL — ABNORMAL HIGH (ref 65–99)
Glucose-Capillary: 139 mg/dL — ABNORMAL HIGH (ref 65–99)

## 2018-01-02 MED ORDER — CEFTRIAXONE SODIUM 1 G IJ SOLR
1.0000 g | INTRAMUSCULAR | Status: DC
  Administered 2018-01-02 – 2018-01-03 (×2): 1 g via INTRAVENOUS
  Filled 2018-01-02 (×3): qty 10

## 2018-01-02 MED ORDER — AMLODIPINE BESYLATE 10 MG PO TABS
10.0000 mg | ORAL_TABLET | Freq: Every day | ORAL | Status: DC
  Administered 2018-01-02 – 2018-01-04 (×3): 10 mg via ORAL
  Filled 2018-01-02 (×3): qty 1

## 2018-01-02 NOTE — Care Management Note (Addendum)
Case Management Note  Patient Details  Name: Elwyn LadeMarie Sorto MRN: [id number] Date of Birth: 03/09/1926  Subjective/Objective:  Admitted for AKI on Stage IV CKD.           Action/Plan: IVF; Strict I & O, avoid nephrotoxic's;Renal USS, bladder scan.  Expected Discharge Date:   TBD               Expected Discharge Plan:  Home/Self Care  Discharge planning Services  CM Consult  Status of Service:  In process, will continue to follow  Additional Comments: In to speak with patient.  Prior to admission patient lived at home alone.  Home & is able to ambulate  independently. Daughter brought her to the hospital and can pick-up her up at discharge.  PCP is Adrian PrinceStephen South. Uses [facility] on [address]  Denies inability to afford medications or food.  Daughter takes her to medical appointments.  Home DME: none.Will be returning to the same living situation after discharge.  [facility] and has no preference is needed at discharge. NCM will continue follow for discharge transition home.  Yancey FlemingsKimberly R Becton, RN  Nurse case manager [place] 01/02/2018, 11:26 AM

## 2018-01-02 NOTE — Progress Notes (Signed)
  Echocardiogram 2D Echocardiogram has been performed.  Jermichael Belmares L Androw 01/02/2018, 3:06 PM

## 2018-01-02 NOTE — Progress Notes (Signed)
Patient bladder scanned and found retaining 413 mLs. RN notified on call NP, Craige CottaKirby. Awaiting orders.  Veatrice KellsMahmoud,Erielle Gawronski I, RN

## 2018-01-02 NOTE — Care Management CC44 (Deleted)
Condition Code 44 Documentation Completed  Patient Details  Name: [name] MRN: [id number] Date of Birth: 01-Nov-1926   Condition Code 44 given:  Yes Patient signature on Condition Code 44 notice:  Yes Documentation of 2 MD's agreement:  Yes Code 44 added to claim:  Yes    Yancey FlemingsKimberly R Becton, RN 01/02/2018, 11:41 AM

## 2018-01-02 NOTE — Progress Notes (Signed)
PROGRESS NOTE  [name] ONG:[id number] DOB: Nov 22, 1925 DOA: 01/01/2018 PCP: Adrian Prince, MD  HPI/Recap of past 24 hours: [name] is a 84 y.o. female with medical history significant for HTN, DM type 2, Hx of gastroparesis, presents to the ED c/o of "feeling off" for the past few days. Pt initially reported to the ED docs that she was feeling nauseated without vomiting. Pt denies abdominal pain, chest pain, SOB, fever/chills, diarrhea, dizziness, dysuria, no focal neurologic deficit. Pt reported not drinking enough fluids and still compliant with meds. Pt reported seeing her PCP about 2 weeks ago and was told labs were stable. Of note, pt daughter reported pt might be worried/anxious as her sister/pt other daughter is in the ICU. Pt lives alone, ambulates unassisted, and able to prepare her meals. In the ED, pt was noted to have numerous WBC, leukocytes in U/A with AKI. Pt admitted for possible UTI and AKI.  Today, pt reported feeling better, denies any chest pain, SOB, fever/chills, dysuria, abdominal pain. Awaiting UC for and improvement of AKI.     Assessment/Plan: Principal Problem:   Acute kidney injury (HCC) Active Problems:   Type 2 diabetes mellitus with other specified complication (HCC)   Essential hypertension   Acute lower UTI   Acute renal failure (ARF) (HCC)  AKI on Stage IV CKD Ongoing Baseline Cr around 1.74 2.29 on admission Likely 2/2 poor oral intake Vs medication induced IVF Strict I & O, avoid nephrotoxics Renal USS medical renal disease, no hydronephrosis Daily BMP  Possible UTI U/A showed large leukocytes, TNTC WBC with clumps UC pending In the ED, started on IV ceftriaxone, will continue pending UC  HTN Uncontrolled Continue home clonidine, hydralazine, amlodipine  Type 2 DM A1c 7.8 SSI Held humalog     Code Status: Full  Family Communication: None at bedside  Disposition Plan: Home likely  01/03/18   Consultants:  None  Procedures:  None  Antimicrobials:  IV Ceftriaxone  DVT prophylaxis:  Heparin   Objective: Vitals:   01/02/18 0626 01/02/18 0951 01/02/18 1544 01/02/18 1545  BP: 135/61 (!) 174/58 (!) 150/52 (!) 150/52  Pulse: 80 92  78  Resp: 19 18    Temp: 98.3 F (36.8 C) 98.7 F (37.1 C)  99.8 F (37.7 C)  TempSrc: Oral Oral  Oral  SpO2: 98% 100%  98%  Weight:      Height:        Intake/Output Summary (Last 24 hours) at 01/02/2018 1637 Last data filed at 01/02/2018 1545 Gross per 24 hour  Intake 3176.75 ml  Output 1675 ml  Net 1501.75 ml   Filed Weights   01/01/18 1542 01/01/18 2225  Weight: 86.2 kg (190 lb) 86.2 kg (190 lb 0.6 oz)    Exam:   General:  NAD, oriented  Cardiovascular: S1, S2 present  Respiratory: CTA  Abdomen: Soft, non-tender, ND, BS+  Musculoskeletal: Trace pedal edema b/l  Skin: Normal  Psychiatry: Pleasant   Data Reviewed: CBC: Recent Labs  Lab 01/01/18 1543 01/02/18 0649  WBC 9.1 5.2  HGB 11.3* 9.9*  HCT 34.1* 30.6*  MCV 90.2 90.8  PLT 396 309   Basic Metabolic Panel: Recent Labs  Lab 01/01/18 1543 01/02/18 0649  NA 134* 136  K 4.9 5.0  CL 99* 104  CO2 22 23  GLUCOSE 118* 121*  BUN 16 15  CREATININE 2.29* 2.26*  CALCIUM 9.5 8.7*   GFR: Estimated Creatinine Clearance: 17.6 mL/min (A) (by C-G formula based on SCr of  2.26 mg/dL (H)). Liver Function Tests: Recent Labs  Lab 01/01/18 1543 01/02/18 0649  AST 29 25  ALT 19 16  ALKPHOS 115 95  BILITOT 0.4 0.5  PROT 7.9 6.5  ALBUMIN 3.8 3.0*   No results for input(s): LIPASE, AMYLASE in the last 168 hours. No results for input(s): AMMONIA in the last 168 hours. Coagulation Profile: No results for input(s): INR, PROTIME in the last 168 hours. Cardiac Enzymes: Recent Labs  Lab 01/01/18 1901 01/02/18 0218 01/02/18 0649  TROPONINI <0.03 0.03* 0.03*   BNP (last 3 results) No results for input(s): PROBNP in the last 8760  hours. HbA1C: Recent Labs    01/01/18 1901  HGBA1C 7.8*   CBG: Recent Labs  Lab 01/01/18 2156 01/02/18 0808 01/02/18 1219  GLUCAP 181* 118* 194*   Lipid Profile: No results for input(s): CHOL, HDL, LDLCALC, TRIG, CHOLHDL, LDLDIRECT in the last 72 hours. Thyroid Function Tests: No results for input(s): TSH, T4TOTAL, FREET4, T3FREE, THYROIDAB in the last 72 hours. Anemia Panel: No results for input(s): VITAMINB12, FOLATE, FERRITIN, TIBC, IRON, RETICCTPCT in the last 72 hours. Urine analysis:    Component Value Date/Time   COLORURINE YELLOW 01/01/2018 1543   APPEARANCEUR HAZY (A) 01/01/2018 1543   LABSPEC 1.008 01/01/2018 1543   PHURINE 8.0 01/01/2018 1543   GLUCOSEU 50 (A) 01/01/2018 1543   HGBUR NEGATIVE 01/01/2018 1543   BILIRUBINUR NEGATIVE 01/01/2018 1543   KETONESUR NEGATIVE 01/01/2018 1543   PROTEINUR 100 (A) 01/01/2018 1543   UROBILINOGEN 0.2 03/05/2015 1626   NITRITE NEGATIVE 01/01/2018 1543   LEUKOCYTESUR LARGE (A) 01/01/2018 1543   Sepsis Labs: @LABRCNTIP (procalcitonin:4,lacticidven:4)  )No results found for this or any previous visit (from the past 240 hour(s)).    Studies: [place] Renal  Result Date: 01/02/2018 CLINICAL DATA:  Acute kidney injury EXAM: RENAL / URINARY TRACT ULTRASOUND COMPLETE COMPARISON:  None. FINDINGS: Right Kidney: Length: 10.6 cm. Increased cortical echogenicity. Cyst in the upper pole measuring 3.8 x 3.6 x 2.8 cm. Cyst near the renal pelvis measuring 2.3 by 2.8 x 1.1 cm. No hydronephrosis. Left Kidney: Length: 9.8 cm. Increased cortical echogenicity with thinning present. No hydronephrosis. Bladder: Appears normal for degree of bladder distention. IMPRESSION: 1. Increased cortical echogenicity consistent with medical renal disease. Cortical thinning on the left suggesting mild atrophy. No hydronephrosis 2. Right renal cysts Electronically Signed   By: Jasmine PangKim  Fujinaga M.D.   On: 01/02/2018 00:56   Dg Chest Port 1 View  Result Date:  01/02/2018 CLINICAL DATA:  Encounter for SOB EXAM: PORTABLE CHEST 1 VIEW COMPARISON:  03/18/2015 FINDINGS: Cardiac silhouette is enlarged. Exam is lordotic which can exaggerates the cardiac silhouette. Atherosclerotic calcification aortic arch. No effusion, infiltrate pneumothorax. No acute osseous abnormality. IMPRESSION: Mild cardiomegaly without acute cardiopulmonary findings. Electronically Signed   By: Genevive BiStewart  Edmunds M.D.   On: 01/02/2018 10:36    Scheduled Meds: . cloNIDine  0.1 mg Oral Daily  . cloNIDine  0.2 mg Oral QHS  . heparin  5,000 Units Subcutaneous Q8H  . hydrALAZINE  25 mg Oral TID  . insulin aspart  0-5 Units Subcutaneous QHS  . insulin aspart  0-9 Units Subcutaneous TID WC  . pantoprazole  40 mg Oral Daily  . sodium chloride flush  3 mL Intravenous Q12H  . sodium chloride flush  3 mL Intravenous Q12H    Continuous Infusions: . sodium chloride 75 mL/hr at 01/02/18 1043  . sodium chloride    . cefTRIAXone (ROCEPHIN)  IV  LOS: 0 days     Briant Cedar, MD [facility]   If 7PM-7AM, please contact night-coverage [email or web address] Password [facility] 01/02/2018, 4:37 PM

## 2018-01-02 NOTE — Progress Notes (Signed)
Patient In and Out catheterized.  475mLs output. RN notified Public relations account executiveoncoming RN.  Veatrice KellsMahmoud,Juelz Whittenberg I, RN

## 2018-01-02 NOTE — Care Management Obs Status (Signed)
MEDICARE OBSERVATION STATUS NOTIFICATION   Patient Details  Name: Elwyn LadeMarie Calise MRN: [id number] Date of Birth: November 21, 1925   Medicare Observation Status Notification Given:  Yes    Yancey FlemingsKimberly R Becton, RN 01/02/2018, 11:41 AM

## 2018-01-03 DIAGNOSIS — N390 Urinary tract infection, site not specified: Secondary | ICD-10-CM | POA: Diagnosis not present

## 2018-01-03 DIAGNOSIS — I10 Essential (primary) hypertension: Secondary | ICD-10-CM | POA: Diagnosis not present

## 2018-01-03 DIAGNOSIS — E1169 Type 2 diabetes mellitus with other specified complication: Secondary | ICD-10-CM | POA: Diagnosis not present

## 2018-01-03 DIAGNOSIS — N179 Acute kidney failure, unspecified: Secondary | ICD-10-CM | POA: Diagnosis not present

## 2018-01-03 LAB — BASIC METABOLIC PANEL
ANION GAP: 10 (ref 5–15)
BUN: 18 mg/dL (ref 6–20)
CALCIUM: 9 mg/dL (ref 8.9–10.3)
CO2: 22 mmol/L (ref 22–32)
CREATININE: 2.19 mg/dL — AB (ref 0.44–1.00)
Chloride: 104 mmol/L (ref 101–111)
GFR calc Af Amer: 22 mL/min — ABNORMAL LOW (ref >60)
GFR calc non Af Amer: 19 mL/min — ABNORMAL LOW (ref >60)
GLUCOSE: 199 mg/dL — AB (ref 65–99)
Potassium: 4.9 mmol/L (ref 3.5–5.1)
Sodium: 136 mmol/L (ref 135–145)

## 2018-01-03 LAB — CBC WITH DIFFERENTIAL/PLATELET
BASOS ABS: 0 10*3/uL (ref 0.0–0.1)
Basophils Relative: 1 %
EOS PCT: 4 %
Eosinophils Absolute: 0.3 10*3/uL (ref 0.0–0.7)
HCT: 32.9 % — ABNORMAL LOW (ref 36.0–46.0)
Hemoglobin: 10.8 g/dL — ABNORMAL LOW (ref 12.0–15.0)
LYMPHS PCT: 42 %
Lymphs Abs: 2.7 10*3/uL (ref 0.7–4.0)
MCH: 30.3 pg (ref 26.0–34.0)
MCHC: 32.8 g/dL (ref 30.0–36.0)
MCV: 92.2 fL (ref 78.0–100.0)
MONO ABS: 0.4 10*3/uL (ref 0.1–1.0)
Monocytes Relative: 6 %
Neutro Abs: 3.1 10*3/uL (ref 1.7–7.7)
Neutrophils Relative %: 47 %
PLATELETS: 320 10*3/uL (ref 150–400)
RBC: 3.57 MIL/uL — ABNORMAL LOW (ref 3.87–5.11)
RDW: 14.7 % (ref 11.5–15.5)
WBC: 6.5 10*3/uL (ref 4.0–10.5)

## 2018-01-03 LAB — URINE CULTURE

## 2018-01-03 LAB — GLUCOSE, CAPILLARY
GLUCOSE-CAPILLARY: 168 mg/dL — AB (ref 65–99)
Glucose-Capillary: 193 mg/dL — ABNORMAL HIGH (ref 65–99)
Glucose-Capillary: 184 mg/dL — ABNORMAL HIGH (ref 65–99)
Glucose-Capillary: 211 mg/dL — ABNORMAL HIGH (ref 65–99)

## 2018-01-03 MED ORDER — FUROSEMIDE 10 MG/ML IJ SOLN
40.0000 mg | Freq: Every day | INTRAMUSCULAR | Status: DC
  Administered 2018-01-03 – 2018-01-04 (×2): 40 mg via INTRAVENOUS
  Filled 2018-01-03 (×2): qty 4

## 2018-01-03 NOTE — Evaluation (Signed)
Physical Therapy Evaluation Patient Details Name: [name] MRN: [id number] DOB: [birth year] Today's Date: 01/03/2018   History of Present Illness  Pt is an 84 y/o female admitted secondary "not feeling well" for a few days. Pt found to have an AKI and possible UTI. PMH including but not limited to DM, HTN and CKD.  Clinical Impression  Pt presented supine in bed with HOB elevated, awake and willing to participate in therapy session. Prior to admission, pt reported that she was independent with all functional mobility and ADLs. Pt lives alone and has family available to assist her if needed. Pt currently requires supervision for bed mobility, min guard for transfers and ambulated in hallway with one UE support on IV pole and min guard, no LOB or need for physical assistance. Pt would continue to benefit from skilled physical therapy services at this time while admitted and after d/c to address the below listed limitations in order to improve overall safety and independence with functional mobility.     Follow Up Recommendations Home health PT    Equipment Recommendations  None recommended by PT;Other (comment)(would benefit from a RW, but pt refusing at this time)    Recommendations for Other Services       Precautions / Restrictions Precautions Precautions: Fall Restrictions Weight Bearing Restrictions: No      Mobility  Bed Mobility Overal bed mobility: Needs Assistance Bed Mobility: Supine to Sit     Supine to sit: Supervision     General bed mobility comments: for safety  Transfers Overall transfer level: Needs assistance Equipment used: None Transfers: Sit to/from Stand Sit to Stand: Min guard         General transfer comment: increased time, min guard for safety, pt with mild instability with transitional movement but no need for physical assistance  Ambulation/Gait Ambulation/Gait assistance: Min guard Ambulation Distance (Feet): 200 Feet Assistive  device: 1 person hand held assist(pushed IV pole) Gait Pattern/deviations: Step-through pattern;Decreased step length - right;Decreased step length - left;Decreased stride length Gait velocity: decreased   General Gait Details: pt with mild instability but no overt LOB or need for physical assistance, min guard for safety; pt with preference for pushing IV pole; once back in her room pt preferring to hold therapist's hand  Stairs            Wheelchair Mobility    Modified Rankin (Stroke Patients Only)       Balance Overall balance assessment: Needs assistance Sitting-balance support: Feet supported Sitting balance-Leahy Scale: Good     Standing balance support: During functional activity;No upper extremity supported Standing balance-Leahy Scale: Fair                               Pertinent Vitals/Pain Pain Assessment: No/denies pain    Home Living Family/patient expects to be discharged to:: Private residence Living Arrangements: Alone Available Help at Discharge: Family;Available 24 hours/day Type of Home: House Home Access: Level entry     Home Layout: One level Home Equipment: Cane - single point      Prior Function Level of Independence: Independent         Comments: has family to drive her     Hand Dominance        Extremity/Trunk Assessment   Upper Extremity Assessment Upper Extremity Assessment: Overall WFL for tasks assessed    Lower Extremity Assessment Lower Extremity Assessment: Overall WFL for tasks assessed  Communication   Communication: No difficulties  Cognition Arousal/Alertness: Awake/alert Behavior During Therapy: WFL for tasks assessed/performed Overall Cognitive Status: No family/caregiver present to determine baseline cognitive functioning Area of Impairment: Memory;Attention                   Current Attention Level: Sustained Memory: Decreased short-term memory         General Comments:  pt very talkative and repeated herself several times during session      General Comments      Exercises     Assessment/Plan    PT Assessment Patient needs continued PT services  PT Problem List Decreased balance;Decreased coordination;Decreased mobility;Decreased cognition;Decreased knowledge of use of DME       PT Treatment Interventions DME instruction;Gait training;Stair training;Therapeutic activities;Functional mobility training;Therapeutic exercise;Balance training;Neuromuscular re-education;Cognitive remediation;Patient/family education    PT Goals (Current goals can be found in the Care Plan section)  Acute Rehab PT Goals Patient Stated Goal: return home PT Goal Formulation: With patient Time For Goal Achievement: 01/17/18 Potential to Achieve Goals: Good    Frequency Min 3X/week   Barriers to discharge        Co-evaluation               AM-PAC PT "6 Clicks" Daily Activity  Outcome Measure Difficulty turning over in bed (including adjusting bedclothes, sheets and blankets)?: None Difficulty moving from lying on back to sitting on the side of the bed? : None Difficulty sitting down on and standing up from a chair with arms (e.g., wheelchair, bedside commode, etc,.)?: A Little Help needed moving to and from a bed to chair (including a wheelchair)?: A Little Help needed walking in hospital room?: A Little Help needed climbing 3-5 steps with a railing? : A Lot 6 Click Score: 19    End of Session Equipment Utilized During Treatment: Gait belt Activity Tolerance: Patient tolerated treatment well Patient left: in chair;with call bell/phone within reach;with chair alarm set Nurse Communication: Mobility status PT Visit Diagnosis: Other abnormalities of gait and mobility (R26.89)    Time: 1610-9604 PT Time Calculation (min) (ACUTE ONLY): 23 min   Charges:   PT Evaluation $PT Eval Low Complexity: 1 Low PT Treatments $Gait Training: 8-22 mins   PT G  Codes:        Little Silver, PT, DPT [phone]   [name] 01/03/2018, 10:45 AM

## 2018-01-03 NOTE — Progress Notes (Signed)
PROGRESS NOTE  [name] YNW:[id number] DOB: [birth year] DOA: 01/01/2018 PCP: Adrian Prince, MD  HPI/Recap of past 24 hours: [name] is a 84 y.o. female with medical history significant for HTN, DM type 2, Hx of gastroparesis, presents to the ED c/o of "feeling off" for the past few days. Pt initially reported to the ED docs that she was feeling nauseated without vomiting. Pt denies abdominal pain, chest pain, SOB, fever/chills, diarrhea, dizziness, dysuria, no focal neurologic deficit. Pt reported not drinking enough fluids and still compliant with meds. Pt reported seeing her PCP about 2 weeks ago and was told labs were stable. Of note, pt daughter reported pt might be worried/anxious as her sister/pt other daughter is in the ICU. Pt lives alone, ambulates unassisted, and able to prepare her meals. In the ED, pt was noted to have numerous WBC, leukocytes in U/A with AKI. Pt admitted for possible UTI and AKI.  Today, pt reported feeling better, denies any chest pain, SOB, fever/chills. No improvement of AKI. UC showed multiple specie. Denies dysuria  Assessment/Plan: Principal Problem:   Acute kidney injury (HCC) Active Problems:   Type 2 diabetes mellitus with other specified complication (HCC)   Essential hypertension   Acute lower UTI   Acute renal failure (ARF) (HCC)  AKI on Stage IV CKD No significant improvement, ??new baseline Vs may need diuresis Baseline Cr around 1.74 2.29 on admission S/P IVF w/o improvement Will gave IV lasix today and continue home dose Strict I & O, avoid nephrotoxics Renal USS medical renal disease, no hydronephrosis Daily BMP  Possible UTI U/A showed large leukocytes, TNTC WBC with clumps UC with multiple species, suggest re-collection. No dysuria Continue IV ceftriaxone for a total of 3-5 days  HTN Uncontrolled Continue home clonidine, hydralazine, amlodipine  Type 2 DM A1c 7.8 SSI Held humalog     Code Status:  Full  Family Communication: None at bedside  Disposition Plan: Home likely 01/04/18   Consultants:  None  Procedures:  None  Antimicrobials:  IV Ceftriaxone  DVT prophylaxis:  Heparin   Objective: Vitals:   01/03/18 0547 01/03/18 0900 01/03/18 1034 01/03/18 1522  BP: (!) 153/77 (!) 150/72 (!) 156/94 (!) 177/78  Pulse: 73 85    Resp: 17 18    Temp: 98.7 F (37.1 C) 98.6 F (37 C)    TempSrc:  Oral    SpO2: 100% 100%    Weight:      Height:        Intake/Output Summary (Last 24 hours) at 01/03/2018 1634 Last data filed at 01/03/2018 0900 Gross per 24 hour  Intake 1920.5 ml  Output 450 ml  Net 1470.5 ml   Filed Weights   01/01/18 1542 01/01/18 2225 01/02/18 2017  Weight: 86.2 kg (190 lb) 86.2 kg (190 lb 0.6 oz) 86.6 kg (191 lb)    Exam:   General:  NAD, oriented  Cardiovascular: S1, S2 present  Respiratory: CTA  Abdomen: Soft, non-tender, ND, BS+  Musculoskeletal: Trace pedal edema b/l  Skin: Normal  Psychiatry: Pleasant   Data Reviewed: CBC: Recent Labs  Lab 01/01/18 1543 01/02/18 0649 01/03/18 0537  WBC 9.1 5.2 6.5  NEUTROABS  --   --  3.1  HGB 11.3* 9.9* 10.8*  HCT 34.1* 30.6* 32.9*  MCV 90.2 90.8 92.2  PLT 396 309 320   Basic Metabolic Panel: Recent Labs  Lab 01/01/18 1543 01/02/18 0649 01/03/18 0537  NA 134* 136 136  K 4.9 5.0 4.9  CL 99*  104 104  CO2 22 23 22   GLUCOSE 118* 121* 199*  BUN 16 15 18   CREATININE 2.29* 2.26* 2.19*  CALCIUM 9.5 8.7* 9.0   GFR: Estimated Creatinine Clearance: 18.2 mL/min (A) (by C-G formula based on SCr of 2.19 mg/dL (H)). Liver Function Tests: Recent Labs  Lab 01/01/18 1543 01/02/18 0649  AST 29 25  ALT 19 16  ALKPHOS 115 95  BILITOT 0.4 0.5  PROT 7.9 6.5  ALBUMIN 3.8 3.0*   No results for input(s): LIPASE, AMYLASE in the last 168 hours. No results for input(s): AMMONIA in the last 168 hours. Coagulation Profile: No results for input(s): INR, PROTIME in the last 168  hours. Cardiac Enzymes: Recent Labs  Lab 01/01/18 1901 01/02/18 0218 01/02/18 0649  TROPONINI <0.03 0.03* 0.03*   BNP (last 3 results) No results for input(s): PROBNP in the last 8760 hours. HbA1C: Recent Labs    01/01/18 1901  HGBA1C 7.8*   CBG: Recent Labs  Lab 01/02/18 1219 01/02/18 1703 01/02/18 2058 01/03/18 0741 01/03/18 1145  GLUCAP 194* 139* 162* 193* 184*   Lipid Profile: No results for input(s): CHOL, HDL, LDLCALC, TRIG, CHOLHDL, LDLDIRECT in the last 72 hours. Thyroid Function Tests: No results for input(s): TSH, T4TOTAL, FREET4, T3FREE, THYROIDAB in the last 72 hours. Anemia Panel: No results for input(s): VITAMINB12, FOLATE, FERRITIN, TIBC, IRON, RETICCTPCT in the last 72 hours. Urine analysis:    Component Value Date/Time   COLORURINE YELLOW 01/01/2018 1543   APPEARANCEUR HAZY (A) 01/01/2018 1543   LABSPEC 1.008 01/01/2018 1543   PHURINE 8.0 01/01/2018 1543   GLUCOSEU 50 (A) 01/01/2018 1543   HGBUR NEGATIVE 01/01/2018 1543   BILIRUBINUR NEGATIVE 01/01/2018 1543   KETONESUR NEGATIVE 01/01/2018 1543   PROTEINUR 100 (A) 01/01/2018 1543   UROBILINOGEN 0.2 03/05/2015 1626   NITRITE NEGATIVE 01/01/2018 1543   LEUKOCYTESUR LARGE (A) 01/01/2018 1543   Sepsis Labs: @LABRCNTIP (procalcitonin:4,lacticidven:4)  ) Recent Results (from the past 240 hour(s))  Urine Culture     Status: Abnormal   Collection Time: 01/01/18  5:12 PM  Result Value Ref Range Status   Specimen Description URINE, RANDOM  Final   Special Requests   Final    NONE Performed at [facility], [address] [place], [place] [address]    Culture MULTIPLE SPECIES PRESENT, SUGGEST RECOLLECTION (A)  Final   Report Status 01/03/2018 FINAL  Final      Studies: No results found.  Scheduled Meds: . amLODipine  10 mg Oral Daily  . cloNIDine  0.1 mg Oral Daily  . cloNIDine  0.2 mg Oral QHS  . furosemide  40 mg Intravenous Daily  . heparin  5,000 Units Subcutaneous Q8H  .  hydrALAZINE  25 mg Oral TID  . insulin aspart  0-5 Units Subcutaneous QHS  . insulin aspart  0-9 Units Subcutaneous TID WC  . pantoprazole  40 mg Oral Daily  . sodium chloride flush  3 mL Intravenous Q12H  . sodium chloride flush  3 mL Intravenous Q12H    Continuous Infusions: . sodium chloride    . cefTRIAXone (ROCEPHIN)  IV Stopped (01/02/18 1812)     LOS: 0 days     Briant CedarNkeiruka J Nemiah Kissner, MD [facility]   If 7PM-7AM, please contact night-coverage [email or web address] Password [email or web address] 01/03/2018, 4:34 PM

## 2018-01-03 NOTE — Care Management Note (Signed)
Case Management Note  Patient Details  Name: [name] MRN: [id number] Date of Birth: 1926-07-10  Action/Plan: Patient with discharge orders home today.  In to speak with patient, Daughter "[name]" and grandson at bedside.  Permission given to speak in front of daughter/grandson.  Discussed recommendation for DME: RW and HH PT with patient and daughter. Offered choice, selected [facility] for DME and [facility]  Ref[facility] for D[facility] with [facility].    Expected Discharge Date:    01/03/2018              Expected Discharge Plan:  Home/Self Care  Discharge planning Services  CM Consult  Post Acute Care Choice:  Durable Medical Equipment, Home Health Choice offered to:  Patient, Adult Children  DME Arranged:  [place] rolling DME Agency:  [facility]  HH [facility]   [facility]  Status of Service:  Completed, signed off  Yancey FlemingsKimberly R Nzinga Ferran, BSN, RN Nurse Case Manager [place] 01/03/2018, 1:21 PM

## 2018-01-04 DIAGNOSIS — N390 Urinary tract infection, site not specified: Secondary | ICD-10-CM | POA: Diagnosis not present

## 2018-01-04 DIAGNOSIS — I10 Essential (primary) hypertension: Secondary | ICD-10-CM | POA: Diagnosis not present

## 2018-01-04 DIAGNOSIS — N179 Acute kidney failure, unspecified: Secondary | ICD-10-CM | POA: Diagnosis not present

## 2018-01-04 DIAGNOSIS — E1169 Type 2 diabetes mellitus with other specified complication: Secondary | ICD-10-CM | POA: Diagnosis not present

## 2018-01-04 LAB — IRON AND TIBC
Iron: 81 ug/dL (ref 28–170)
Saturation Ratios: 28 % (ref 10.4–31.8)
TIBC: 291 ug/dL (ref 250–450)
UIBC: 210 ug/dL

## 2018-01-04 LAB — BASIC METABOLIC PANEL
ANION GAP: 9 (ref 5–15)
BUN: 22 mg/dL — ABNORMAL HIGH (ref 6–20)
CO2: 23 mmol/L (ref 22–32)
Calcium: 8.6 mg/dL — ABNORMAL LOW (ref 8.9–10.3)
Chloride: 98 mmol/L — ABNORMAL LOW (ref 101–111)
Creatinine, Ser: 2.17 mg/dL — ABNORMAL HIGH (ref 0.44–1.00)
GFR calc Af Amer: 22 mL/min — ABNORMAL LOW (ref >60)
GFR, EST NON AFRICAN AMERICAN: 19 mL/min — AB (ref >60)
Glucose, Bld: 180 mg/dL — ABNORMAL HIGH (ref 65–99)
POTASSIUM: 4.7 mmol/L (ref 3.5–5.1)
SODIUM: 130 mmol/L — AB (ref 135–145)

## 2018-01-04 LAB — FERRITIN: FERRITIN: 66 ng/mL (ref 11–307)

## 2018-01-04 LAB — GLUCOSE, CAPILLARY
GLUCOSE-CAPILLARY: 166 mg/dL — AB (ref 65–99)
GLUCOSE-CAPILLARY: 129 mg/dL — AB (ref 65–99)

## 2018-01-04 MED ORDER — CEPHALEXIN 500 MG PO CAPS: 500.0000 mg | ORAL_CAPSULE | Freq: Two times a day (BID) | ORAL | 0 refills | Status: AC

## 2018-01-04 MED ORDER — FUROSEMIDE 40 MG PO TABS: 40.0000 mg | ORAL_TABLET | Freq: Every day | ORAL | 0 refills | Status: DC

## 2018-01-04 NOTE — Progress Notes (Signed)
Discharge instructions discussed with pt and daughter at bedside, verbalized understanding. Copy of AVS given. Pt was escorted out of the unit in wheelchair.

## 2018-01-04 NOTE — Discharge Summary (Signed)
Discharge Summary  [name] VHQ:[id number] DOB: [birth year]  PCP: Adrian Prince, MD  Admit date: 01/01/2018 Discharge date: 01/04/2018  Time spent: < 30 mins  Recommendations for Outpatient Follow-up:  1. PCP  Discharge Diagnoses:  Active Hospital Problems   Diagnosis Date Noted  . Acute kidney injury (HCC) 01/01/2018  . Acute lower UTI 01/01/2018  . Acute renal failure (ARF) (HCC) 01/01/2018  . Essential hypertension 07/22/2007  . Type 2 diabetes mellitus with other specified complication (HCC) 07/22/2007    Resolved Hospital Problems  No resolved problems to display.    Discharge Condition: Stable  Diet recommendation: Heart healthy  Vitals:   01/04/18 0538 01/04/18 0900  BP: (!) 166/68 131/62  Pulse: 76 65  Resp: 18 18  Temp: 97.7 F (36.5 C) 98.5 F (36.9 C)  SpO2: 100% 100%    History of present illness:  [name] a 84 y.o.femalewith medical history significant forHTN, DM type 2, Hx of gastroparesis, presents to the ED c/o of "feeling off" for the past few days. Pt initially reported to the ED docs that she was feeling nauseated without vomiting. Pt denies abdominal pain, chest pain, SOB, fever/chills, diarrhea, dizziness, dysuria, no focal neurologic deficit. Pt reported not drinking enough fluids and still compliant with meds. Pt reported seeing her PCP about 2 weeks ago and was told labs were stable. Of note, pt daughter reported pt might be worried/anxious as her sister/pt other daughter is in the ICU. Pt lives alone, ambulates unassisted, andable to prepare her meals. In the ED, pt was noted to have numerous WBC, leukocytes in U/A with AKI. Pt admitted for possible UTI and AKI.  Today, pt denies any new complaints, denies any dizziness, chest pain, SOB. Pt is stable for discharge.  Hospital Course:  Principal Problem:   Acute kidney injury [facility]) Active Problems:   Type 2 diabetes mellitus with other specified complication (HCC)   Essential  hypertension   Acute lower UTI   Acute renal failure (ARF) (HCC)  AKI on Stage IV CKD No significant improvement, likely new baseline Baseline Cr around 1.74 in 2016 During admission, Cr remained around 2 despite adequate hydration and at some point diuresis Renal USS medical renal disease, no hydronephrosis PCP to follow up closely  UTI U/A showed large leukocytes, TNTC WBC with clumps UC with multiple species, No dysuria S/P IV ceftriaxone x 3 days. Discharge on PO keflex for a total of 5 days  Chronic Diastolic HF ECHO showed EF of 41-32%, wall motion normal, grade 1DD BNP 156 Started on low dose lasix PCP to follow up  HTN Stable Continue home clonidine, hydralazine, amlodipine  Type 2 DM A1c 7.8 Continue home humalog     Procedures:  None   Consultations:  None  Discharge Exam: BP 131/62 (BP Location: Right Arm)   Pulse 65   Temp 98.5 F (36.9 C) (Oral)   Resp 18   Ht 5\' 3"  (1.6 m)   Wt 81.5 kg (179 lb 9.6 oz)   SpO2 100%   BMI 31.81 kg/m   General: NAD Cardiovascular: S1, S2 present Respiratory: CTA  Discharge Instructions You were cared for by a hospitalist during your hospital stay. If you have any questions about your discharge medications or the care you received while you were in the hospital after you are discharged, you can call the unit and asked to speak with the hospitalist on call if the hospitalist that took care of you is not available. Once you are discharged,  your primary care physician will handle any further medical issues. Please note that NO REFILLS for any discharge medications will be authorized once you are discharged, as it is imperative that you return to your primary care physician (or establish a relationship with a primary care physician if you do not have one) for your aftercare needs so that they can reassess your need for medications and monitor your lab values.   Allergies as of 01/04/2018      Reactions    Ciprofloxacin    Unknown   Diltiazem Hcl    Unknown      Medication List    STOP taking these medications   bumetanide 2 MG tablet Commonly known as:  BUMEX   losartan 100 MG tablet Commonly known as:  COZAAR     TAKE these medications   ALPRAZolam 0.5 MG tablet Commonly known as:  XANAX Take 0.25 mg by mouth daily as needed for anxiety (anxiety).   amLODipine 10 MG tablet Commonly known as:  NORVASC Take 1 tablet (10 mg total) by mouth daily.   cephALEXin 500 MG capsule Commonly known as:  KEFLEX Take 1 capsule (500 mg total) by mouth 2 (two) times daily for 2 days.   cloNIDine 0.1 MG tablet Commonly known as:  CATAPRES Take 0.1 mg by mouth See admin instructions. Take 0.1 mg in the morning and 0.2 mg in the evening   furosemide 40 MG tablet Commonly known as:  LASIX Take 1 tablet (40 mg total) by mouth daily.   HUMALOG MIX 75/25 KWIKPEN (75-25) 100 UNIT/ML Kwikpen Generic drug:  Insulin Lispro Prot & Lispro Inject 20 Units into the skin 2 (two) times daily.   hydrALAZINE 25 MG tablet Commonly known as:  APRESOLINE Take 25 mg by mouth 3 (three) times daily.   meclizine 12.5 MG tablet Commonly known as:  ANTIVERT Take 6.25 mg by mouth at bedtime as needed for dizziness (dizzines).   omeprazole 20 MG capsule Commonly known as:  PRILOSEC Take 40 mg by mouth every morning.            Durable Medical Equipment  (From admission, onward)        Start     Ordered   01/03/18 1313  For home use only DME Walker rolling  Once    Question:  Patient needs a walker to treat with the following condition  Answer:  Other abnormalities of gait and mobility   01/03/18 1313     Allergies  Allergen Reactions  . Ciprofloxacin     Unknown  . Diltiazem Hcl     Unknown   Follow-up Information    [facility] Follow up.   Specialty:  Home Health Services Why:  Will call you to set-up initial visit Contact information: [address] [place] [address] [phone]        [facility] - Dme Follow up.   Why:  Arranged to be delivered prior to discharge. Contact information: [address] [place] [address] [phone]        Adrian [facility], Stephen, MD. Schedule an appointment as soon as possible for a visit in 1 week(s).   Specialty:  Endocrinology Contact information: [address] [place] [address] [phone]            The results of significant diagnostics from this hospitalization (including imaging, microbiology, ancillary and laboratory) are listed below for reference.    Significant Diagnostic  Studies: US Renal  Result Date: 01/02/2018 CLINICAL DATA:  Acute kidney injury EXAM: RENAL / URINARY TRACT ULTRASOUND COMPLETE COMPARISON:  None. FINDINGS: Right Kidney: Length: 10.6 cm. Increased cortical echogenicity. Cyst in the upper pole measuring 3.8 x 3.6 x 2.8 cm. Cyst near the renal pelvis measuring 2.3 by 2.8 x 1.1 cm. No hydronephrosis. Left Kidney: Length: 9.8 cm. Increased cortical echogenicity with thinning present. No hydronephrosis. Bladder: Appears normal for degree of bladder distention. IMPRESSION: 1. Increased cortical echogenicity consistent with medical renal disease. Cortical thinning on the left suggesting mild atrophy. No hydronephrosis 2. Right renal cysts Electronically Signed   By: Jasmine Pang M.D.   On: 01/02/2018 00:56   Dg Chest Port 1 View  Result Date: 01/02/2018 CLINICAL DATA:  Encounter for SOB EXAM: PORTABLE CHEST 1 VIEW COMPARISON:  03/18/2015 FINDINGS: Cardiac silhouette is enlarged. Exam is lordotic which can exaggerates the cardiac silhouette. Atherosclerotic calcification aortic arch. No effusion, infiltrate pneumothorax. No acute osseous abnormality. IMPRESSION: Mild cardiomegaly without acute cardiopulmonary findings. Electronically Signed   By: Genevive Bi M.D.   On: 01/02/2018 10:36    Microbiology: Recent Results (from  the past 240 hour(s))  Urine Culture     Status: Abnormal   Collection Time: 01/01/18  5:12 PM  Result Value Ref Range Status   Specimen Description URINE, RANDOM  Final   Special Requests   Final    NONE Performed at [facility], [address] [place], [place] [address]    Culture MULTIPLE SPECIES PRESENT, SUGGEST RECOLLECTION (A)  Final   Report Status 01/03/2018 FINAL  Final     Labs: Basic Metabolic Panel: Recent Labs  Lab 01/01/18 1543 01/02/18 0649 01/03/18 0537 01/04/18 0741  NA 134* 136 136 130*  K 4.9 5.0 4.9 4.7  CL 99* 104 104 98*  CO2 22 23 22 23   GLUCOSE 118* 121* 199* 180*  BUN 16 15 18  22*  CREATININE 2.29* 2.26* 2.19* 2.17*  CALCIUM 9.5 8.7* 9.0 8.6*   Liver Function Tests: Recent Labs  Lab 01/01/18 1543 01/02/18 0649  AST 29 25  ALT 19 16  ALKPHOS 115 95  BILITOT 0.4 0.5  PROT 7.9 6.5  ALBUMIN 3.8 3.0*   No results for input(s): LIPASE, AMYLASE in the last 168 hours. No results for input(s): AMMONIA in the last 168 hours. CBC: Recent Labs  Lab 01/01/18 1543 01/02/18 0649 01/03/18 0537  WBC 9.1 5.2 6.5  NEUTROABS  --   --  3.1  HGB 11.3* 9.9* 10.8*  HCT 34.1* 30.6* 32.9*  MCV 90.2 90.8 92.2  PLT 396 309 320   Cardiac Enzymes: Recent Labs  Lab 01/01/18 1901 01/02/18 0218 01/02/18 0649  TROPONINI <0.03 0.03* 0.03*   BNP: BNP (last 3 results) Recent Labs    01/01/18 1901  BNP 156.0*    ProBNP (last 3 results) No results for input(s): PROBNP in the last 8760 hours.  CBG: Recent Labs  Lab 01/03/18 1145 01/03/18 1716 01/03/18 2152 01/04/18 0805 01/04/18 1150  GLUCAP 184* 168* 211* 166* 129*       Signed:  Briant Cedar, MD [facility] 01/04/2018, 3:59 PM

## 2018-07-12 ENCOUNTER — Observation Stay (HOSPITAL_COMMUNITY): Payer: Medicare Other

## 2018-07-12 ENCOUNTER — Encounter (HOSPITAL_COMMUNITY): Payer: Self-pay | Admitting: *Deleted

## 2018-07-12 ENCOUNTER — Observation Stay (HOSPITAL_COMMUNITY)
Admission: EM | Admit: 2018-07-12 | Discharge: 2018-07-15 | Disposition: A | Discharge status: Skilled Nursing Facility | Payer: Medicare Other | Attending: Internal Medicine | Admitting: Internal Medicine

## 2018-07-12 ENCOUNTER — Other Ambulatory Visit: Payer: Self-pay

## 2018-07-12 ENCOUNTER — Emergency Department (HOSPITAL_COMMUNITY): Payer: Medicare Other

## 2018-07-12 DIAGNOSIS — Z79899 Other long term (current) drug therapy: Secondary | ICD-10-CM | POA: Insufficient documentation

## 2018-07-12 DIAGNOSIS — E11649 Type 2 diabetes mellitus with hypoglycemia without coma: Secondary | ICD-10-CM | POA: Diagnosis not present

## 2018-07-12 DIAGNOSIS — E871 Hypo-osmolality and hyponatremia: Secondary | ICD-10-CM | POA: Diagnosis not present

## 2018-07-12 DIAGNOSIS — Z794 Long term (current) use of insulin: Secondary | ICD-10-CM | POA: Diagnosis not present

## 2018-07-12 DIAGNOSIS — I872 Venous insufficiency (chronic) (peripheral): Secondary | ICD-10-CM | POA: Diagnosis not present

## 2018-07-12 DIAGNOSIS — E1143 Type 2 diabetes mellitus with diabetic autonomic (poly)neuropathy: Secondary | ICD-10-CM | POA: Diagnosis not present

## 2018-07-12 DIAGNOSIS — I340 Nonrheumatic mitral (valve) insufficiency: Secondary | ICD-10-CM | POA: Insufficient documentation

## 2018-07-12 DIAGNOSIS — R109 Unspecified abdominal pain: Secondary | ICD-10-CM

## 2018-07-12 DIAGNOSIS — N181 Chronic kidney disease, stage 1: Secondary | ICD-10-CM | POA: Diagnosis not present

## 2018-07-12 DIAGNOSIS — E7800 Pure hypercholesterolemia, unspecified: Secondary | ICD-10-CM | POA: Diagnosis present

## 2018-07-12 DIAGNOSIS — K5900 Constipation, unspecified: Secondary | ICD-10-CM | POA: Diagnosis not present

## 2018-07-12 DIAGNOSIS — E1122 Type 2 diabetes mellitus with diabetic chronic kidney disease: Secondary | ICD-10-CM | POA: Insufficient documentation

## 2018-07-12 DIAGNOSIS — R339 Retention of urine, unspecified: Secondary | ICD-10-CM | POA: Diagnosis not present

## 2018-07-12 DIAGNOSIS — Z881 Allergy status to other antibiotic agents status: Secondary | ICD-10-CM | POA: Insufficient documentation

## 2018-07-12 DIAGNOSIS — F0390 Unspecified dementia without behavioral disturbance: Secondary | ICD-10-CM | POA: Diagnosis not present

## 2018-07-12 DIAGNOSIS — R140 Abdominal distension (gaseous): Secondary | ICD-10-CM | POA: Diagnosis not present

## 2018-07-12 DIAGNOSIS — I313 Pericardial effusion (noninflammatory): Secondary | ICD-10-CM | POA: Diagnosis not present

## 2018-07-12 DIAGNOSIS — E875 Hyperkalemia: Secondary | ICD-10-CM | POA: Diagnosis not present

## 2018-07-12 DIAGNOSIS — M109 Gout, unspecified: Secondary | ICD-10-CM | POA: Insufficient documentation

## 2018-07-12 DIAGNOSIS — Z9119 Patient's noncompliance with other medical treatment and regimen: Secondary | ICD-10-CM | POA: Insufficient documentation

## 2018-07-12 DIAGNOSIS — R1084 Generalized abdominal pain: Secondary | ICD-10-CM | POA: Diagnosis not present

## 2018-07-12 DIAGNOSIS — M1611 Unilateral primary osteoarthritis, right hip: Secondary | ICD-10-CM | POA: Diagnosis not present

## 2018-07-12 DIAGNOSIS — E785 Hyperlipidemia, unspecified: Secondary | ICD-10-CM | POA: Diagnosis not present

## 2018-07-12 DIAGNOSIS — N184 Chronic kidney disease, stage 4 (severe): Secondary | ICD-10-CM

## 2018-07-12 DIAGNOSIS — I129 Hypertensive chronic kidney disease with stage 1 through stage 4 chronic kidney disease, or unspecified chronic kidney disease: Secondary | ICD-10-CM | POA: Diagnosis not present

## 2018-07-12 DIAGNOSIS — R55 Syncope and collapse: Secondary | ICD-10-CM | POA: Diagnosis present

## 2018-07-12 DIAGNOSIS — I10 Essential (primary) hypertension: Secondary | ICD-10-CM | POA: Diagnosis present

## 2018-07-12 DIAGNOSIS — K3184 Gastroparesis: Secondary | ICD-10-CM | POA: Diagnosis not present

## 2018-07-12 DIAGNOSIS — N179 Acute kidney failure, unspecified: Secondary | ICD-10-CM | POA: Diagnosis not present

## 2018-07-12 DIAGNOSIS — E1169 Type 2 diabetes mellitus with other specified complication: Secondary | ICD-10-CM | POA: Diagnosis present

## 2018-07-12 HISTORY — DX: Syncope and collapse: R55

## 2018-07-12 LAB — CREATININE, URINE, RANDOM: CREATININE, URINE: 99.9 mg/dL

## 2018-07-12 LAB — URINALYSIS, ROUTINE W REFLEX MICROSCOPIC
BACTERIA UA: NONE SEEN
Bilirubin Urine: NEGATIVE
Glucose, UA: 150 mg/dL — AB
Ketones, ur: NEGATIVE mg/dL
Leukocytes, UA: NEGATIVE
Nitrite: NEGATIVE
PH: 7 (ref 5.0–8.0)
Protein, ur: >=300 mg/dL — AB
SPECIFIC GRAVITY, URINE: 1.012 (ref 1.005–1.030)

## 2018-07-12 LAB — COMPREHENSIVE METABOLIC PANEL
ALK PHOS: 101 U/L (ref 38–126)
ALT: 14 U/L (ref 0–44)
AST: 22 U/L (ref 15–41)
Albumin: 3.5 g/dL (ref 3.5–5.0)
Anion gap: 9 (ref 5–15)
BUN: 31 mg/dL — AB (ref 8–23)
CALCIUM: 9.3 mg/dL (ref 8.9–10.3)
CO2: 20 mmol/L — ABNORMAL LOW (ref 22–32)
CREATININE: 2.53 mg/dL — AB (ref 0.44–1.00)
Chloride: 102 mmol/L (ref 98–111)
GFR calc Af Amer: 18 mL/min — ABNORMAL LOW (ref >60)
GFR, EST NON AFRICAN AMERICAN: 16 mL/min — AB (ref >60)
Glucose, Bld: 310 mg/dL — ABNORMAL HIGH (ref 70–99)
Potassium: 6.3 mmol/L (ref 3.5–5.1)
Sodium: 131 mmol/L — ABNORMAL LOW (ref 135–145)
Total Bilirubin: 0.8 mg/dL (ref 0.3–1.2)
Total Protein: 7.3 g/dL (ref 6.5–8.1)

## 2018-07-12 LAB — CBC WITH DIFFERENTIAL/PLATELET
ABS IMMATURE GRANULOCYTES: 0 10*3/uL (ref 0.0–0.1)
Basophils Absolute: 0 10*3/uL (ref 0.0–0.1)
Basophils Relative: 0 %
EOS ABS: 0 10*3/uL (ref 0.0–0.7)
Eosinophils Relative: 0 %
HEMATOCRIT: 32.8 % — AB (ref 36.0–46.0)
HEMOGLOBIN: 10.7 g/dL — AB (ref 12.0–15.0)
Immature Granulocytes: 0 %
LYMPHS ABS: 0.9 10*3/uL (ref 0.7–4.0)
LYMPHS PCT: 11 %
MCH: 30.1 pg (ref 26.0–34.0)
MCHC: 32.6 g/dL (ref 30.0–36.0)
MCV: 92.1 fL (ref 78.0–100.0)
MONOS PCT: 5 %
Monocytes Absolute: 0.4 10*3/uL (ref 0.1–1.0)
Neutro Abs: 6.9 10*3/uL (ref 1.7–7.7)
Neutrophils Relative %: 84 %
Platelets: 312 10*3/uL (ref 150–400)
RBC: 3.56 MIL/uL — AB (ref 3.87–5.11)
RDW: 13.4 % (ref 11.5–15.5)
WBC: 8.2 10*3/uL (ref 4.0–10.5)

## 2018-07-12 LAB — CBG MONITORING, ED: Glucose-Capillary: 251 mg/dL — ABNORMAL HIGH (ref 70–99)

## 2018-07-12 LAB — BASIC METABOLIC PANEL
ANION GAP: 11 (ref 5–15)
BUN: 30 mg/dL — ABNORMAL HIGH (ref 8–23)
CO2: 18 mmol/L — AB (ref 22–32)
Calcium: 9.3 mg/dL (ref 8.9–10.3)
Chloride: 104 mmol/L (ref 98–111)
Creatinine, Ser: 2.36 mg/dL — ABNORMAL HIGH (ref 0.44–1.00)
GFR calc Af Amer: 20 mL/min — ABNORMAL LOW (ref >60)
GFR, EST NON AFRICAN AMERICAN: 17 mL/min — AB (ref >60)
Glucose, Bld: 132 mg/dL — ABNORMAL HIGH (ref 70–99)
POTASSIUM: 5.4 mmol/L — AB (ref 3.5–5.1)
Sodium: 133 mmol/L — ABNORMAL LOW (ref 135–145)

## 2018-07-12 LAB — GLUCOSE, CAPILLARY: GLUCOSE-CAPILLARY: 119 mg/dL — AB (ref 70–99)

## 2018-07-12 LAB — I-STAT TROPONIN, ED: TROPONIN I, POC: 0.02 ng/mL (ref 0.00–0.08)

## 2018-07-12 LAB — NA AND K (SODIUM & POTASSIUM), RAND UR
Potassium Urine: 58 mmol/L
SODIUM UR: 32 mmol/L

## 2018-07-12 LAB — TROPONIN I: Troponin I: <0.03 ng/mL (ref <0.03)

## 2018-07-12 LAB — LACTIC ACID, PLASMA: Lactic Acid, Venous: 1.1 mmol/L (ref 0.5–1.9)

## 2018-07-12 LAB — OSMOLALITY, URINE: OSMOLALITY UR: 357 mosm/kg (ref 300–900)

## 2018-07-12 MED ORDER — ACETAMINOPHEN 325 MG PO TABS: 650.0000 mg | ORAL_TABLET | Freq: Four times a day (QID) | ORAL | Status: DC | PRN

## 2018-07-12 MED ORDER — INSULIN ASPART 100 UNIT/ML IV SOLN
5.0000 [IU] | Freq: Once | INTRAVENOUS | Status: AC
  Administered 2018-07-12: 5 [IU] via INTRAVENOUS
  Filled 2018-07-12: qty 0.05

## 2018-07-12 MED ORDER — SODIUM CHLORIDE 0.9 % IV BOLUS
500.0000 mL | Freq: Once | INTRAVENOUS | Status: AC
  Administered 2018-07-12: 500 mL via INTRAVENOUS

## 2018-07-12 MED ORDER — SODIUM CHLORIDE 0.9 % IV SOLN
INTRAVENOUS | Status: AC
  Administered 2018-07-13: via INTRAVENOUS

## 2018-07-12 MED ORDER — CLONIDINE HCL 0.1 MG PO TABS
0.1000 mg | ORAL_TABLET | Freq: Every morning | ORAL | Status: DC
  Administered 2018-07-13 – 2018-07-15 (×3): 0.1 mg via ORAL
  Filled 2018-07-12 (×4): qty 1

## 2018-07-12 MED ORDER — POLYETHYLENE GLYCOL 3350 17 G PO PACK: 17.0000 g | PACK | Freq: Every day | ORAL | Status: DC | PRN

## 2018-07-12 MED ORDER — CLONIDINE HCL 0.2 MG PO TABS
0.2000 mg | ORAL_TABLET | Freq: Every evening | ORAL | Status: DC
  Administered 2018-07-12 – 2018-07-15 (×4): 0.2 mg via ORAL
  Filled 2018-07-12 (×4): qty 1

## 2018-07-12 MED ORDER — CLONIDINE HCL 0.1 MG PO TABS: 0.1000 mg | ORAL_TABLET | ORAL | Status: DC

## 2018-07-12 MED ORDER — ONDANSETRON HCL 4 MG PO TABS: 4.0000 mg | ORAL_TABLET | Freq: Four times a day (QID) | ORAL | Status: DC | PRN

## 2018-07-12 MED ORDER — ONDANSETRON HCL 4 MG/2ML IJ SOLN: 4.0000 mg | Freq: Four times a day (QID) | INTRAMUSCULAR | Status: DC | PRN

## 2018-07-12 MED ORDER — INSULIN ASPART 100 UNIT/ML ~~LOC~~ SOLN
0.0000 [IU] | SUBCUTANEOUS | Status: DC
  Administered 2018-07-13 (×2): 1 [IU] via SUBCUTANEOUS
  Administered 2018-07-13: 3 [IU] via SUBCUTANEOUS
  Administered 2018-07-13 (×2): 1 [IU] via SUBCUTANEOUS
  Administered 2018-07-14 (×2): 2 [IU] via SUBCUTANEOUS
  Filled 2018-07-12 (×7): qty 1

## 2018-07-12 MED ORDER — SODIUM POLYSTYRENE SULFONATE 15 GM/60ML PO SUSP
15.0000 g | Freq: Once | ORAL | Status: AC
  Administered 2018-07-13: 15 g via ORAL
  Filled 2018-07-12: qty 60

## 2018-07-12 MED ORDER — SENNA 8.6 MG PO TABS
1.0000 | ORAL_TABLET | Freq: Two times a day (BID) | ORAL | Status: DC
  Administered 2018-07-12 – 2018-07-15 (×6): 8.6 mg via ORAL
  Filled 2018-07-12 (×6): qty 1

## 2018-07-12 MED ORDER — ACETAMINOPHEN 650 MG RE SUPP: 650.0000 mg | Freq: Four times a day (QID) | RECTAL | Status: DC | PRN

## 2018-07-12 MED ORDER — PANTOPRAZOLE SODIUM 40 MG PO TBEC
40.0000 mg | DELAYED_RELEASE_TABLET | Freq: Every day | ORAL | Status: DC
  Administered 2018-07-13 – 2018-07-15 (×3): 40 mg via ORAL
  Filled 2018-07-12 (×3): qty 1

## 2018-07-12 MED ORDER — CALCIUM GLUCONATE 10 % IV SOLN
1.0000 g | Freq: Once | INTRAVENOUS | Status: AC
  Administered 2018-07-12: 1 g via INTRAVENOUS
  Filled 2018-07-12: qty 10

## 2018-07-12 NOTE — H&P (Addendum)
[name] ZOX:[id number] DOB: 1925/12/12 DOA: 07/12/2018     PCP: Adrian Prince, MD   Outpatient Specialists:     NEphrology:  Dr. Posey Rea of the name    Patient arrived to ER on 07/12/18 at 1533  Patient coming from: home Lives alone,     Chief Complaint:  Chief Complaint  Patient presents with  . Fall    HPI: [name] is a 84 y.o. female with medical history significant of HTN, DM type 2, Hx of gastroparesis, CKD, dementia    Presented with   Possible syncope. Daughter was talking with mother on the phone today at 10 AM but was not able to get a hold of her by 2 PM daughter came home and found her leaning against a couch she was awake and conversant She is unsure how she ended up on the floor no pain no chest pain associated this. Patient has trouble walking has to lean on furniture  She lives alone with family near by. patient thinks she fell. The house was warm she has a habit of turning off [facility] Family did not check blood sugar but gave her  Orange juice  Family states they had been given her Humalog Mix 75/25 because patient herself stopped taking her insulin.  Family have misunderstood instructions and will given 40 units whenever her blood sugars went down into 70s but only give 10 units when the blood sugar were elevated.  Discussed with family at length danger of hypoglycemia family states that they need help with education and proper management.  Noted that patient has self stopped taking her medications secondary to memory deficits.  Regarding pertinent Chronic problems: Patient with history of hypertension on Norvasc Catapres History of diabetes on Humalog 75/25 recently has been   While in ER:  Noted to be hyperkalemic potassium up to 6.3 which was treated in the emergency department Sodium noted to be down to 131 creatinine 2.53 CT head/neck WNL The following Work up has been ordered so far:  Orders Placed This Encounter  Procedures  . Critical  Care  . DG Chest Port 1 View  . CT HEAD WO CONTRAST  . CT CERVICAL SPINE WO CONTRAST  . CBC WITH DIFFERENTIAL  . Comprehensive metabolic panel  . Urinalysis, Routine w reflex microscopic  . Cardiac monitoring  . Interrogate Pacemaker if present  . Initiate Carrier Fluid Protocol  . Vital signs  . In and Out Cath  . Face-to-face encounter (required for Medicare/Medicaid patients)  . Home Health  . Place Patient on a Cardiac Monitor  . Consult to social work  . Consult to case management  . Consult to hospitalist  . Pulse oximetry, continuous  . CBG monitoring, ED  . I-stat troponin, ED  . POC CBG, ED  . EKG 12-Lead  . EKG 12-Lead  . Insert peripheral IV     Following Medications were ordered in ER: Medications  sodium chloride 0.9 % bolus 500 mL (500 mLs Intravenous New Bag/Given 07/12/18 1808)  insulin aspart (novoLOG) injection 5 Units (5 Units Intravenous Given 07/12/18 1807)  calcium gluconate inj 10% (1 g) URGENT USE ONLY! (1 g Intravenous Given 07/12/18 1806)    Significant initial  Findings: Abnormal Labs Reviewed  CBC WITH DIFFERENTIAL/PLATELET - Abnormal; Notable for the following components:      Result Value   RBC 3.56 (*)    Hemoglobin 10.7 (*)    HCT 32.8 (*)    All other components within normal  limits  COMPREHENSIVE METABOLIC PANEL - Abnormal; Notable for the following components:   Sodium 131 (*)    Potassium 6.3 (*)    CO2 20 (*)    Glucose, Bld 310 (*)    BUN 31 (*)    Creatinine, Ser 2.53 (*)    GFR calc non Af Amer 16 (*)    GFR calc Af Amer 18 (*)    All other components within normal limits  URINALYSIS, ROUTINE W REFLEX MICROSCOPIC - Abnormal; Notable for the following components:   Glucose, UA 150 (*)    Hgb urine dipstick SMALL (*)    Protein, ur >=300 (*)    All other components within normal limits  CBG MONITORING, ED - Abnormal; Notable for the following components:   Glucose-Capillary 251 (*)    All other components within normal  limits     Na 131 K 6.3  Cr   Up from baseline see below Lab Results  Component Value Date   CREATININE 2.53 (H) 07/12/2018   CREATININE 2.17 (H) 01/04/2018   CREATININE 2.19 (H) 01/03/2018      WBC  8.2  HG/HCT   Stable,     Component Value Date/Time   HGB 10.7 (L) 07/12/2018 1649   HCT 32.8 (L) 07/12/2018 1649       Troponin (Point of Care Test) Recent Labs    07/12/18 1659  TROPIPOC 0.02     BNP (last 3 results) Recent Labs    01/01/18 1901  BNP 156.0*    ProBNP (last 3 results) No results for input(s): PROBNP in the last 8760 hours.  Lactic Acid, Venous No results found for: LATICACIDVEN    UA   no evidence of UTI    CT HEAD   NON acute  CXR - NON acute    ECG:  Personally reviewed by me showing: HR : 70 Rhythm:  NSR with left  anterior fascicular block  no evidence of ischemic changes QTC 488     ED Triage Vitals  Enc Vitals Group     BP 07/12/18 1551 (!) 147/60     Pulse Rate 07/12/18 1551 66     Resp 07/12/18 1551 15     Temp 07/12/18 1551 (!) 97.5 F (36.4 C)     Temp Source 07/12/18 1551 Oral     SpO2 07/12/18 1551 100 %     Weight --      Height --      Head Circumference --      Peak Flow --      Pain Score 07/12/18 1539 0     Pain Loc --      Pain Edu? --      Excl. in GC? --   TMAX(24)@       Latest  Blood pressure (!) 147/60, pulse 66, temperature (!) 97.5 F (36.4 C), temperature source Oral, resp. rate 15, SpO2 100 %.    Hospitalist was called for admission for syncope in the setting of hyperkalemia   Review of Systems:    Pertinent positives include: Confusion falls unstable gait  Constitutional:  No weight loss, night sweats, Fevers, chills, fatigue, weight loss  HEENT:  No headaches, Difficulty swallowing,Tooth/dental problems,Sore throat,  No sneezing, itching, ear ache, nasal congestion, post nasal drip,  Cardio-vascular:  No chest pain, Orthopnea, PND, anasarca, dizziness, palpitations.no  Bilateral lower extremity swelling  GI:  No heartburn, indigestion, abdominal pain, nausea, vomiting, diarrhea, change in bowel habits, loss of appetite, melena,  blood in stool, hematemesis Resp:  no shortness of breath at rest. No dyspnea on exertion, No excess mucus, no productive cough, No non-productive cough, No coughing up of blood.No change in color of mucus.No wheezing. Skin:  no rash or lesions. No jaundice GU:  no dysuria, change in color of urine, no urgency or frequency. No straining to urinate.  No flank pain.  Musculoskeletal:  No joint pain or no joint swelling. No decreased range of motion. No back pain.  Psych:  No change in mood or affect. No depression or anxiety. No memory loss.  Neuro: no localizing neurological complaints, no tingling, no weakness, no double vision, no gait abnormality, no slurred speech, no confusion  All systems reviewed and apart from HOPI all are negative  Past Medical History:   Past Medical History:  Diagnosis Date  . Arthritis    rt hip  . Chronic kidney disease, stage I   . Dyspepsia and other specified disorders of function of stomach   . Gout, unspecified   . Headache   . Heat stroke and sunstroke    pt states she doesn't remember this -04/30/12  . Hemorrhoid   . Other and unspecified hyperlipidemia   . Type II or unspecified type diabetes mellitus without mention of complication, not stated as uncontrolled   . Unspecified essential hypertension       Past Surgical History:  Procedure Laterality Date  . COLON SURGERY    . TONSILLECTOMY      Social History:  Ambulatory trouble walking    reports that she has never smoked. She has never used smokeless tobacco. She reports that she does not drink alcohol or use drugs.     Family History:   Family History  Problem Relation Age of Onset  . Diabetes Sister   . Breast cancer Sister   . Stroke Son   . Hypertension Son   . Heart disease Mother   . Gout Father   .  Cancer Sister        breast- recovered  . Coronary artery disease Neg Hx   . Heart attack Neg Hx     Allergies: Allergies  Allergen Reactions  . Ciprofloxacin Other (See Comments)    Unknown reaction  . Diltiazem Hcl Other (See Comments)    Unknown reaction     Prior to Admission medications   Medication Sig Start Date End Date Taking? Authorizing Provider  amLODipine (NORVASC) 10 MG tablet Take 1 tablet (10 mg total) by mouth daily. 10/18/15  Yes Etta GrandchildJones, Thomas L, MD  cloNIDine (CATAPRES) 0.1 MG tablet Take 0.1-0.2 mg by mouth See admin instructions. Take one tablet (0.1 mg) by mouth every morning and 2 tablets (0.2 mg) at night   Yes [provider]  hydrALAZINE (APRESOLINE) 25 MG tablet Take 25 mg by mouth 3 (three) times daily. 12/13/17  Yes [provider]  Insulin Lispro Prot & Lispro (HUMALOG MIX 75/25 KWIKPEN) (75-25) 100 UNIT/ML Kwikpen Inject 10-40 Units into the skin See admin instructions. Inject 10-40 units subcutaneously once daily - CBG >190 10 units <190 40 units   Yes [provider]  omeprazole (PRILOSEC) 20 MG capsule Take 40 mg by mouth daily.  10/06/17  Yes [provider]  furosemide (LASIX) 40 MG tablet Take 1 tablet (40 mg total) by mouth daily. Patient not taking: Reported on 07/12/2018 01/04/18 02/03/18  Briant CedarEzenduka, Nkeiruka J, MD  simvastatin (ZOCOR) 40 MG tablet Take 20 mg by mouth at bedtime.  01/29/12  [provider]   Physical Exam: Blood pressure (!) 147/60, pulse 66, temperature (!) 97.5 F (36.4 C), temperature source Oral, resp. rate 15, SpO2 100 %. 1. General:  in No Acute distress  Chronically ill -appearing 2. Psychological: Alert and  Oriented to self not situation 3. Head/ENT:    Dry Mucous Membranes                          Head Non traumatic, neck supple                             Poor Dentition 4. SKIN:   decreased Skin turgor,  Skin clean Dry and intact no rash 5. Heart: Regular rate and rhythm no  Murmur, no Rub or gallop 6. Lungs:   no wheezes or crackles   7. Abdomen: Soft, tender,  distended  bowel sounds present, appears to have distended bladder 8. Lower extremities: no clubbing, cyanosis, or  Edema, poor nail care with multiple small ulcerations and fall smelling bloody  discharge warmth or erythema noted.  No clear source of infection no ulcerations noted 9. Neurologically Grossly intact, moving all 4 extremities equally   10. MSK: Normal range of motion   LABS:     Recent Labs  Lab 07/12/18 1649  WBC 8.2  NEUTROABS 6.9  HGB 10.7*  HCT 32.8*  MCV 92.1  PLT 312   Basic Metabolic Panel: Recent Labs  Lab 07/12/18 1649  NA 131*  K 6.3*  CL 102  CO2 20*  GLUCOSE 310*  BUN 31*  CREATININE 2.53*  CALCIUM 9.3      Recent Labs  Lab 07/12/18 1649  AST 22  ALT 14  ALKPHOS 101  BILITOT 0.8  PROT 7.3  ALBUMIN 3.5   No results for input(s): LIPASE, AMYLASE in the last 168 hours. No results for input(s): AMMONIA in the last 168 hours.    HbA1C: No results for input(s): HGBA1C in the last 72 hours. CBG: Recent Labs  Lab 07/12/18 1741  GLUCAP 251*      Urine analysis:    Component Value Date/Time   COLORURINE YELLOW 07/12/2018 1801   APPEARANCEUR CLEAR 07/12/2018 1801   LABSPEC 1.012 07/12/2018 1801   PHURINE 7.0 07/12/2018 1801   GLUCOSEU 150 (A) 07/12/2018 1801   HGBUR SMALL (A) 07/12/2018 1801   BILIRUBINUR NEGATIVE 07/12/2018 1801   KETONESUR NEGATIVE 07/12/2018 1801   PROTEINUR >=300 (A) 07/12/2018 1801   UROBILINOGEN 0.2 03/05/2015 1626   NITRITE NEGATIVE 07/12/2018 1801   LEUKOCYTESUR NEGATIVE 07/12/2018 1801       Cultures:    Component Value Date/Time   SDES URINE, RANDOM 01/01/2018 1712   SPECREQUEST  01/01/2018 1712    NONE Performed at [facility], [address], [place], [place] [address]    CULT MULTIPLE SPECIES PRESENT, SUGGEST RECOLLECTION (A) 01/01/2018 1712   REPTSTATUS 01/03/2018 FINAL 01/01/2018 1712      Radiological Exams on Admission: Ct Head Wo Contrast  Result Date: 07/12/2018 CLINICAL DATA:  84 year old female found on the floor by her daughter today. Weakness. No injury. EXAM: CT HEAD WITHOUT CONTRAST CT CERVICAL SPINE WITHOUT CONTRAST TECHNIQUE: Multidetector CT imaging of the head and cervical spine was performed following the standard protocol without intravenous contrast. Multiplanar CT image reconstructions of the cervical spine were also generated. COMPARISON:  Head CT 03/18/2015. FINDINGS: CT HEAD FINDINGS Brain: Patchy  and confluent areas of decreased attenuation are noted throughout the deep and periventricular white matter of the cerebral hemispheres bilaterally, compatible with chronic microvascular ischemic disease. No evidence of acute infarction, hemorrhage, hydrocephalus, extra-axial collection or mass lesion/mass effect. Vascular: No hyperdense vessel or unexpected calcification. Skull: Normal. Negative for fracture or focal lesion. Sinuses/Orbits: No acute finding. Other: None. CT CERVICAL SPINE FINDINGS Alignment: Reversal of normal cervical lordosis centered at the level of C5, favored to be positional. Alignment is otherwise anatomic. Skull base and vertebrae: No acute fracture. No primary bone lesion or focal pathologic process. Soft tissues and spinal canal: No prevertebral fluid or swelling. No visible canal hematoma. Disc levels: Mild multilevel degenerative disc disease and facet arthropathy. Upper chest: Unremarkable. Other: None. IMPRESSION: 1. No evidence of significant acute traumatic injury to the skull, brain or cervical spine. 2. Chronic microvascular ischemic changes in the cerebral white matter, as above. 3. Mild multilevel degenerative disc disease and cervical spondylosis, as above. Electronically Signed   By: Trudie Reed M.D.   On: 07/12/2018 16:56   Ct Cervical Spine Wo Contrast  Result Date: 07/12/2018 CLINICAL DATA:  84 year old female found on the floor by  her daughter today. Weakness. No injury. EXAM: CT HEAD WITHOUT CONTRAST CT CERVICAL SPINE WITHOUT CONTRAST TECHNIQUE: Multidetector CT imaging of the head and cervical spine was performed following the standard protocol without intravenous contrast. Multiplanar CT image reconstructions of the cervical spine were also generated. COMPARISON:  Head CT 03/18/2015. FINDINGS: CT HEAD FINDINGS Brain: Patchy and confluent areas of decreased attenuation are noted throughout the deep and periventricular white matter of the cerebral hemispheres bilaterally, compatible with chronic microvascular ischemic disease. No evidence of acute infarction, hemorrhage, hydrocephalus, extra-axial collection or mass lesion/mass effect. Vascular: No hyperdense vessel or unexpected calcification. Skull: Normal. Negative for fracture or focal lesion. Sinuses/Orbits: No acute finding. Other: None. CT CERVICAL SPINE FINDINGS Alignment: Reversal of normal cervical lordosis centered at the level of C5, favored to be positional. Alignment is otherwise anatomic. Skull base and vertebrae: No acute fracture. No primary bone lesion or focal pathologic process. Soft tissues and spinal canal: No prevertebral fluid or swelling. No visible canal hematoma. Disc levels: Mild multilevel degenerative disc disease and facet arthropathy. Upper chest: Unremarkable. Other: None. IMPRESSION: 1. No evidence of significant acute traumatic injury to the skull, brain or cervical spine. 2. Chronic microvascular ischemic changes in the cerebral white matter, as above. 3. Mild multilevel degenerative disc disease and cervical spondylosis, as above. Electronically Signed   By: Trudie Reed M.D.   On: 07/12/2018 16:56   Dg Chest Port 1 View  Result Date: 07/12/2018 CLINICAL DATA:  Syncope EXAM: PORTABLE CHEST 1 VIEW COMPARISON:  01/02/2018 chest radiograph. FINDINGS: Stable cardiomediastinal silhouette with mild cardiomegaly. No pneumothorax. No pleural effusion.  Lungs appear clear, with no acute consolidative airspace disease and no pulmonary edema. IMPRESSION: Stable mild cardiomegaly without pulmonary edema. No active pulmonary disease. Electronically Signed   By: Delbert Phenix M.D.   On: 07/12/2018 17:15    Chart has been reviewed    Assessment/Plan  84 y.o. female with medical history significant of HTN, DM type 2, Hx of gastroparesis, CKD     Admitted forsyncope in the setting of hyperkalemia   Present on Admission: . Syncope unsure if true syncope versus fall patient with dementia unable to provide detailed history -  given risk factor will admit rehydrate obtain CE, monitor on tele and obtain carotid dopplers Obtain echogram.  Patient with  progressive dementia family felt that she may have had a stroke but not endorsing any localized complaints.   Unclear etiology for syncope.  Will obtain MRI brain Unsure if hypoglycemia could have been playing a role as family has been administering high levels of insulin for low blood sugars. For now order sliding scale and monitor Given hyperkalemia monitor on telemetry his cardiac etiology such as dysrhythmia is also possibility to explain syncope . CHRONIC KIDNEY DISEASE STAGE I -worsening renal function will rehydrate gently and follow avoid nephrotoxic medications . Essential hypertension -unsure which medications patient actually takes at baseline as she has not been compliant.  Will restart clonidine to avoid rebound tachycardia of hypertension observe and see if patient needs all 3 of her home medications . Gastroparesis stable currently appears to be a symptomatic . Hypercholesteremia -  stable continue home medications . Type 2 diabetes mellitus with chronic kidney disease -hold off on Humalog 75/25 order sliding scale monitor blood sugars if persistently elevated would restart at the lower dosage patient would benefit from family education diabetes coordinator consult and home health .  Hyperkalemia  - treated in the emergency department with IV fluids, calcium and bicarb Give Kayexalate if persists will need IV Lasix and IV bolus Repeat and continue to follow,  . Dementia - chronic watch for sundowning . Hyponatremia -appears to be somewhat on the dry side will rehydrate follow urine electrolytes  Abdominal pain and distention obtain bladder scan and if unable to urinate will place Foley The patient had 600 residual and a bladder and continued to feel uncomfortable KUB showed moderate stool burden will place Foley and order bowel regimen Other plan as per orders.  DVT prophylaxis:  SCD   Code Status:  FULL CODE  as per patient  And family I had personally discussed CODE STATUS with patient and family  Family Communication:   Family  at  Bedside  plan of care was discussed with  Daughter  Disposition Plan:      To home once workup is complete and patient is stable                   Would benefit from PT/OT eval prior to DC  Ordered                   Swallow eval - SLP ordered                      Nutrition    consulted                  Wound care  consulted                  diabetes coordinator                    Consults called: NOne   Admission status:   Obs    Level of care     tele            Therisa Doyne 07/12/2018, 8:29 PM    [facility]  Pager [phone]   after 2 AM please page floor coverage PA If 7AM-7PM, please contact the day team taking care of the patient  [email or web address]  Password [email or web address]

## 2018-07-12 NOTE — ED Notes (Signed)
Nurse starting IV will draw labs. 

## 2018-07-12 NOTE — ED Triage Notes (Signed)
Pt trans ported via EMS after family called when finding Pt on floor in den. Daughter reported talking with mother at 1000 this morning. Daughter reported her Mother was fixing breakfeast . Daughter reported trying to call Mother at approx. 2PM.  Mother's Phone was busy. Daughter found Pt on floor leaning against  Couch. Pt was awake and talking .

## 2018-07-12 NOTE — ED Provider Notes (Signed)
[facility] EMERGENCY DEPARTMENT Provider Note   CSN: [id number] Arrival date & time: 07/12/18  1533     History   Chief Complaint Chief Complaint  Patient presents with  . Fall    HPI [name] is a 84 y.o. female.  The history is provided by the patient.  Loss of Consciousness   This is a new problem. The current episode started 3 to 5 hours ago. The problem occurs rarely. The problem has been resolved. Length of episode of loss of consciousness: possible syncopal event, patient found on the floor by family today. Patient had called a few hours prior and was normal per family. Patient unaware of how she ended up on the floor. Denies any pain.  The problem is associated with an unknown factor. Pertinent negatives include abdominal pain, back pain, bladder incontinence, bowel incontinence, chest pain, clumsiness, confusion, dizziness, fever, focal weakness, headaches, light-headedness, malaise/fatigue, nausea, palpitations, seizures, slurred speech, vertigo, vomiting and weakness. She has tried bed rest for the symptoms. The treatment provided significant relief. Her past medical history does not include CVA. Past medical history comments: DM.    Past Medical History:  Diagnosis Date  . Arthritis    rt hip  . Chronic kidney disease, stage I   . Dyspepsia and other specified disorders of function of stomach   . Gout, unspecified   . Headache   . Heat stroke and sunstroke    pt states she doesn't remember this -04/30/12  . Hemorrhoid   . Other and unspecified hyperlipidemia   . Type II or unspecified type diabetes mellitus without mention of complication, not stated as uncontrolled   . Unspecified essential hypertension     Patient Active Problem List   Diagnosis Date Noted  . Acute kidney injury (HCC) 01/01/2018  . Acute lower UTI 01/01/2018  . Acute renal failure (ARF) (HCC) 01/01/2018  . Hyponatremia 04/30/2012  . Routine general medical examination  at a health care facility 05/09/2011  . GASTROPARESIS 07/05/2010  . INTERNAL HEMORRHOIDS WITHOUT MENTION COMP 05/29/2010  . DYSPEPSIA 03/02/2010  . CHRONIC KIDNEY DISEASE STAGE I 10/21/2009  . HEAT STROKE 06/07/2008  . Type 2 diabetes mellitus with other specified complication (HCC) 07/22/2007  . HYPERLIPIDEMIA 07/22/2007  . GOUT 07/22/2007  . Essential hypertension 07/22/2007    Past Surgical History:  Procedure Laterality Date  . COLON SURGERY    . TONSILLECTOMY       OB History   None      Home Medications    Prior to Admission medications   Medication Sig Start Date End Date Taking? Authorizing Provider  ALPRAZolam Prudy Feeler) 0.5 MG tablet Take 0.25 mg by mouth daily as needed for anxiety (anxiety).     [provider]  amLODipine (NORVASC) 10 MG tablet Take 1 tablet (10 mg total) by mouth daily. 10/18/15   Etta Grandchild, MD  cloNIDine (CATAPRES) 0.1 MG tablet Take 0.1 mg by mouth See admin instructions. Take 0.1 mg in the morning and 0.2 mg in the evening    [provider]  furosemide (LASIX) 40 MG tablet Take 1 tablet (40 mg total) by mouth daily. 01/04/18 02/03/18  Briant Cedar, MD  HUMALOG MIX 75/25 KWIKPEN (75-25) 100 UNIT/ML Kwikpen Inject 20 Units into the skin 2 (two) times daily. 11/27/17   [provider]  hydrALAZINE (APRESOLINE) 25 MG tablet Take 25 mg by mouth 3 (three) times daily. 12/13/17   [provider]  meclizine (ANTIVERT) 12.5  MG tablet Take 6.25 mg by mouth at bedtime as needed for dizziness (dizzines).     [provider]  omeprazole (PRILOSEC) 20 MG capsule Take 40 mg by mouth every morning. 10/06/17   [provider]  simvastatin (ZOCOR) 40 MG tablet Take 20 mg by mouth at bedtime.    01/29/12  [provider]    Family History Family History  Problem Relation Age of Onset  . Diabetes Sister   . Breast cancer Sister   . Stroke Son   . Hypertension Son   . Heart disease Mother     . Gout Father   . Cancer Sister        breast- recovered  . Coronary artery disease Neg Hx   . Heart attack Neg Hx     Social History Social History   Tobacco Use  . Smoking status: Never Smoker  . Smokeless tobacco: Never Used  Substance Use Topics  . Alcohol use: No  . Drug use: No     Allergies   Ciprofloxacin and Diltiazem hcl   Review of Systems Review of Systems  Constitutional: Negative for chills, fever and malaise/fatigue.  HENT: Negative for ear pain and sore throat.   Eyes: Negative for pain and visual disturbance.  Respiratory: Negative for cough and shortness of breath.   Cardiovascular: Positive for syncope. Negative for chest pain and palpitations.  Gastrointestinal: Negative for abdominal pain, bowel incontinence, nausea and vomiting.  Genitourinary: Negative for bladder incontinence, dysuria and hematuria.  Musculoskeletal: Negative for arthralgias, back pain, gait problem and myalgias.  Skin: Negative for color change and rash.  Neurological: Negative for dizziness, vertigo, tremors, focal weakness, seizures, syncope, facial asymmetry, weakness, light-headedness, numbness and headaches.  Psychiatric/Behavioral: Negative for confusion.  All other systems reviewed and are negative.    Physical Exam Updated Vital Signs  ED Triage Vitals  Enc Vitals Group     BP 07/12/18 1551 (!) 147/60     Pulse Rate 07/12/18 1551 66     Resp 07/12/18 1551 15     Temp 07/12/18 1551 (!) 97.5 F (36.4 C)     Temp Source 07/12/18 1551 Oral     SpO2 07/12/18 1551 100 %     Weight --      Height --      Head Circumference --      Peak Flow --      Pain Score 07/12/18 1539 0     Pain Loc --      Pain Edu? --      Excl. in GC? --      Physical Exam  Constitutional: She is oriented to person, place, and time. She appears well-developed and well-nourished. No distress.  HENT:  Head: Normocephalic and atraumatic.  Eyes: Pupils are equal, round, and reactive to  light. Conjunctivae and EOM are normal.  Neck: Normal range of motion. Neck supple.  Cardiovascular: Normal rate, regular rhythm, normal heart sounds and intact distal pulses.  No murmur heard. Pulmonary/Chest: Effort normal and breath sounds normal. No respiratory distress.  Abdominal: Soft. There is no tenderness.  Musculoskeletal: Normal range of motion. She exhibits no edema.  Neurological: She is alert and oriented to person, place, and time. No cranial nerve deficit or sensory deficit. She exhibits normal muscle tone. Coordination normal.  5+/5 strength, normal sensation, normal finger to nose finger, no drift  Skin: Skin is warm and dry.  Psychiatric: She has a normal mood and affect.  Nursing note  and vitals reviewed.    ED Treatments / Results  Labs (all labs ordered are listed, but only abnormal results are displayed) Labs Reviewed  CBC WITH DIFFERENTIAL/PLATELET - Abnormal; Notable for the following components:      Result Value   RBC 3.56 (*)    Hemoglobin 10.7 (*)    HCT 32.8 (*)    All other components within normal limits  COMPREHENSIVE METABOLIC PANEL - Abnormal; Notable for the following components:   Sodium 131 (*)    Potassium 6.3 (*)    CO2 20 (*)    Glucose, Bld 310 (*)    BUN 31 (*)    Creatinine, Ser 2.53 (*)    GFR calc non Af Amer 16 (*)    GFR calc Af Amer 18 (*)    All other components within normal limits  CBG MONITORING, ED - Abnormal; Notable for the following components:   Glucose-Capillary 251 (*)    All other components within normal limits  URINALYSIS, ROUTINE W REFLEX MICROSCOPIC  I-STAT TROPONIN, ED  CBG MONITORING, ED    EKG EKG Interpretation  Date/Time:  Saturday July 12 2018 15:54:45 EDT Ventricular Rate:  70 PR Interval:    QRS Duration: 96 QT Interval:  452 QTC Calculation: 488 R Axis:   -66 Text Interpretation:  Sinus rhythm Prolonged PR interval Probable left atrial enlargement Left anterior fascicular block Left  ventricular hypertrophy Confirmed by Virgina Norfolk [phone]) on 07/12/2018 3:58:27 PM   Radiology Ct Head Wo Contrast  Result Date: 07/12/2018 CLINICAL DATA:  84 year old female found on the floor by her daughter today. Weakness. No injury. EXAM: CT HEAD WITHOUT CONTRAST CT CERVICAL SPINE WITHOUT CONTRAST TECHNIQUE: Multidetector CT imaging of the head and cervical spine was performed following the standard protocol without intravenous contrast. Multiplanar CT image reconstructions of the cervical spine were also generated. COMPARISON:  Head CT 03/18/2015. FINDINGS: CT HEAD FINDINGS Brain: Patchy and confluent areas of decreased attenuation are noted throughout the deep and periventricular white matter of the cerebral hemispheres bilaterally, compatible with chronic microvascular ischemic disease. No evidence of acute infarction, hemorrhage, hydrocephalus, extra-axial collection or mass lesion/mass effect. Vascular: No hyperdense vessel or unexpected calcification. Skull: Normal. Negative for fracture or focal lesion. Sinuses/Orbits: No acute finding. Other: None. CT CERVICAL SPINE FINDINGS Alignment: Reversal of normal cervical lordosis centered at the level of C5, favored to be positional. Alignment is otherwise anatomic. Skull base and vertebrae: No acute fracture. No primary bone lesion or focal pathologic process. Soft tissues and spinal canal: No prevertebral fluid or swelling. No visible canal hematoma. Disc levels: Mild multilevel degenerative disc disease and facet arthropathy. Upper chest: Unremarkable. Other: None. IMPRESSION: 1. No evidence of significant acute traumatic injury to the skull, brain or cervical spine. 2. Chronic microvascular ischemic changes in the cerebral white matter, as above. 3. Mild multilevel degenerative disc disease and cervical spondylosis, as above. Electronically Signed   By: Trudie Reed M.D.   On: 07/12/2018 16:56   Ct Cervical Spine Wo Contrast  Result Date:  07/12/2018 CLINICAL DATA:  84 year old female found on the floor by her daughter today. Weakness. No injury. EXAM: CT HEAD WITHOUT CONTRAST CT CERVICAL SPINE WITHOUT CONTRAST TECHNIQUE: Multidetector CT imaging of the head and cervical spine was performed following the standard protocol without intravenous contrast. Multiplanar CT image reconstructions of the cervical spine were also generated. COMPARISON:  Head CT 03/18/2015. FINDINGS: CT HEAD FINDINGS Brain: Patchy and confluent areas of decreased attenuation are noted throughout the  deep and periventricular white matter of the cerebral hemispheres bilaterally, compatible with chronic microvascular ischemic disease. No evidence of acute infarction, hemorrhage, hydrocephalus, extra-axial collection or mass lesion/mass effect. Vascular: No hyperdense vessel or unexpected calcification. Skull: Normal. Negative for fracture or focal lesion. Sinuses/Orbits: No acute finding. Other: None. CT CERVICAL SPINE FINDINGS Alignment: Reversal of normal cervical lordosis centered at the level of C5, favored to be positional. Alignment is otherwise anatomic. Skull base and vertebrae: No acute fracture. No primary bone lesion or focal pathologic process. Soft tissues and spinal canal: No prevertebral fluid or swelling. No visible canal hematoma. Disc levels: Mild multilevel degenerative disc disease and facet arthropathy. Upper chest: Unremarkable. Other: None. IMPRESSION: 1. No evidence of significant acute traumatic injury to the skull, brain or cervical spine. 2. Chronic microvascular ischemic changes in the cerebral white matter, as above. 3. Mild multilevel degenerative disc disease and cervical spondylosis, as above. Electronically Signed   By: Trudie Reedaniel  Entrikin M.D.   On: 07/12/2018 16:56   Dg Chest Port 1 View  Result Date: 07/12/2018 CLINICAL DATA:  Syncope EXAM: PORTABLE CHEST 1 VIEW COMPARISON:  01/02/2018 chest radiograph. FINDINGS: Stable cardiomediastinal  silhouette with mild cardiomegaly. No pneumothorax. No pleural effusion. Lungs appear clear, with no acute consolidative airspace disease and no pulmonary edema. IMPRESSION: Stable mild cardiomegaly without pulmonary edema. No active pulmonary disease. Electronically Signed   By: Delbert PhenixJason A Poff M.D.   On: 07/12/2018 17:15    Procedures .Critical Care Performed by: Virgina Norfolkuratolo, Cauy Melody, DO Authorized by: Virgina Norfolkuratolo, Gavon Majano, DO   Critical care provider statement:    Critical care time (minutes):  35   Critical care was necessary to treat or prevent imminent or life-threatening deterioration of the following conditions:  Renal failure   Critical care was time spent personally by me on the following activities:  Development of treatment plan with patient or surrogate, discussions with primary provider, evaluation of patient's response to treatment, examination of patient, ordering and performing treatments and interventions, ordering and review of laboratory studies, ordering and review of radiographic studies, re-evaluation of patient's condition and review of old charts   I assumed direction of critical care for this patient from another provider in my specialty: no     (including critical care time)  Medications Ordered in ED Medications  sodium chloride 0.9 % bolus 500 mL (has no administration in time range)  insulin aspart (novoLOG) injection 5 Units (has no administration in time range)  calcium gluconate inj 10% (1 g) URGENT USE ONLY! (has no administration in time range)     Initial Impression / Assessment and Plan / ED Course  I have reviewed the triage vital signs and the nursing notes.  Pertinent labs & imaging results that were available during my care of the patient were reviewed by me and considered in my medical decision making (see chart for details).     [name] is an 84 year old female with history of CKD, diabetes who presents to the ED after syncopal event.  Patient with  normal vitals.  No fever.  Patient was found on the floor by her family about 2 to 3 hours after family had talked to her on the phone.  Patient does not remember the event.  She denies any mechanical aspect of the fall.  Patient was alert and awake and sitting up against the couch and family walked in.  She denies any bony tenderness, headaches, neck pain, chest pain, shortness of breath.  Patient lives by herself and  has family help her with her medications.  Patient had normal blood sugar with EMS.  Patient with overall normal exam.  Normal neuro.  EKG showed sinus rhythm with no signs of ischemic changes.  Patient had lab work that was significant for hyperkalemia at 6.3.  Given syncopal event we will treat with IV calcium given concern for possible arrhythmia.  Will give IV insulin as well.  Patient otherwise with no significant anemia.  Creatinine is at baseline.  Patient does follow with nephrology and states that she does make good urine.  Has not discussed dialysis with nephrology at this point.  Patient with unremarkable head and neck CT.  Chest x-ray shows no signs of pneumonia, pneumothorax, pleural effusion. Trop wnl.   Patient to be admitted to medicine given syncopal event with hyperkalemia.  IV calcium and IV insulin given.  Remained hemodynamically stable throughout my care.  This chart was dictated using voice recognition software.  Despite best efforts to proofread,  errors can occur which can change the documentation meaning.   Final Clinical Impressions(s) / ED Diagnoses   Final diagnoses:  Hyperkalemia  Syncope, unspecified syncope type    ED Discharge Orders    None       Virgina Norfolk, DO 07/12/18 2014

## 2018-07-12 NOTE — Clinical Social Work Note (Signed)
Clinical Social Work Assessment  Patient Details  Name: [name] MRN: [id number] Date of Birth: 1926/01/19  Date of referral:  07/12/18               Reason for consult:  Discharge Planning                Permission sought to share information with:  Family Supports Permission granted to share information::  Yes, Verbal Permission Granted  Name::        Agency::     Relationship::     Contact Information:     Housing/Transportation Living arrangements for the past 2 months:  Single Family Home Source of Information:  Adult Children Patient Interpreter Needed:  None Criminal Activity/Legal Involvement Pertinent to Current Situation/Hospitalization:    Significant Relationships:  Adult Children Lives with:    Do you feel safe going back to the place where you live?  (Unknown) Need for family participation in patient care:  Yes (Comment)  Care giving concerns: Pt was found by daughter sitting on the floor and pt stated she did not know how she "got there".  Pt is diasbetic, per daughter, and is not able to take her own insulin responsibly.  Pt's daughter was not able to completely make clear with the RN whether she gives the pt her insulin every day and seemed to avoid answering the question, per pt's RN.  Pt may not be getting her insulin regularly, per the RN.   Social Worker assessment / plan:  CSW met with pt and pt's family and confirmed pt's plan to be discharged home to live at discharge.  CSW provided active listening and validated pt's concerns.   CSW did not ask and was NOT given permission to complete FL-2 and send referrals out to SNF facilities via the hub per pt's request.  Pt has been living independently prior to being admitted to [facility]  Employment status:  Retired Nurse, adult PT Recommendations:  Not assessed at this time Information / Referral to community resources:     Patient/Family's Response to care:  Patient not completely  alert and oriented.  Patient and pt's daughter agreeable to plan.  Pt's daughter [name] supportive and strongly involved in pt.'s care.  Pt.'s daughter pleasant and appreciated CSW intervention.     Patient/Family's Understanding of and Emotional Response to Diagnosis, Current Treatment, and Prognosis:  Pt and family understand current prognosis and treatment.  Still assessing   Emotional Assessment Appearance:  Appears stated age Attitude/Demeanor/Rapport:    Affect (typically observed):  Accepting, Adaptable, Pleasant, Euphoric Orientation:  Fluctuating Orientation (Suspected and/or reported Sundowners) Alcohol / Substance use:    Psych involvement (Current and /or in the community):     Discharge Needs  Concerns to be addressed:  Home Safety Concerns Readmission within the last 30 days:  No Current discharge risk:  Cognitively Impaired Barriers to Discharge:  No Barriers Identified   [name], LCSWA 07/12/2018, 5:59 PM

## 2018-07-12 NOTE — Progress Notes (Addendum)
CSW received a request from pt's RN to see what services are available in the home.  CSW asked the EPD to place a consult for CM with an order for face-to-face and RN/Aide/PT/OT and Social Work.  EPD voiced understanding but states pt will likely be admitted.  CSW will update family.  If contacting the pt is unsuccessful the pt's family is closely involved and supportive: Primary Contact is - [name] at ph: [phone]  [name] at ph: [phone] [name] at ph: [phone]  CSW will update the pt and pt's family who is at bedside.  5:51 PM   Pt's daughter stated pt has a HX of dementia.  Pt presents with fluctuating orientatiion AEB the pt speaking in disconnected phrases.  EDP states pt will be admitted.  CSW will continue to follow for D/C needs.  Dorothe PeaJonathan F. Etha Stambaugh, LCSW, LCAS, CSI Clinical Social Worker Ph: [phone]

## 2018-07-12 NOTE — Progress Notes (Signed)
Pt currently going to Xray and MRI for her scans at this time.

## 2018-07-12 NOTE — Progress Notes (Signed)
When pt arrived to the unit pt was trying to transfer from stretcher to hospital bed, while trying to stand from stretcher pt need 3 person assist to stand up. While standing pt didn't feel that she had to use the bathroom and used the bathroom on the floor. Pt states that she did not feel that she had peed on her self.

## 2018-07-13 ENCOUNTER — Other Ambulatory Visit (HOSPITAL_COMMUNITY): Payer: Medicare Other

## 2018-07-13 ENCOUNTER — Observation Stay (HOSPITAL_BASED_OUTPATIENT_CLINIC_OR_DEPARTMENT_OTHER): Payer: Medicare Other

## 2018-07-13 ENCOUNTER — Encounter (HOSPITAL_COMMUNITY): Payer: Self-pay | Admitting: Internal Medicine

## 2018-07-13 DIAGNOSIS — R55 Syncope and collapse: Secondary | ICD-10-CM

## 2018-07-13 DIAGNOSIS — I340 Nonrheumatic mitral (valve) insufficiency: Secondary | ICD-10-CM | POA: Diagnosis not present

## 2018-07-13 DIAGNOSIS — F0390 Unspecified dementia without behavioral disturbance: Secondary | ICD-10-CM | POA: Diagnosis not present

## 2018-07-13 DIAGNOSIS — E875 Hyperkalemia: Secondary | ICD-10-CM | POA: Diagnosis not present

## 2018-07-13 DIAGNOSIS — R140 Abdominal distension (gaseous): Secondary | ICD-10-CM | POA: Diagnosis present

## 2018-07-13 DIAGNOSIS — N181 Chronic kidney disease, stage 1: Secondary | ICD-10-CM

## 2018-07-13 LAB — GLUCOSE, CAPILLARY
GLUCOSE-CAPILLARY: 117 mg/dL — AB (ref 70–99)
GLUCOSE-CAPILLARY: 141 mg/dL — AB (ref 70–99)
GLUCOSE-CAPILLARY: 146 mg/dL — AB (ref 70–99)
GLUCOSE-CAPILLARY: 203 mg/dL — AB (ref 70–99)
GLUCOSE-CAPILLARY: 58 mg/dL — AB (ref 70–99)
Glucose-Capillary: 122 mg/dL — ABNORMAL HIGH (ref 70–99)
Glucose-Capillary: 131 mg/dL — ABNORMAL HIGH (ref 70–99)
Glucose-Capillary: 134 mg/dL — ABNORMAL HIGH (ref 70–99)
Glucose-Capillary: 140 mg/dL — ABNORMAL HIGH (ref 70–99)

## 2018-07-13 LAB — COMPREHENSIVE METABOLIC PANEL
ALK PHOS: 87 U/L (ref 38–126)
ALT: 13 U/L (ref 0–44)
AST: 21 U/L (ref 15–41)
Albumin: 2.8 g/dL — ABNORMAL LOW (ref 3.5–5.0)
Anion gap: 6 (ref 5–15)
BILIRUBIN TOTAL: 0.6 mg/dL (ref 0.3–1.2)
BUN: 26 mg/dL — AB (ref 8–23)
CALCIUM: 8.8 mg/dL — AB (ref 8.9–10.3)
CO2: 22 mmol/L (ref 22–32)
CREATININE: 2.25 mg/dL — AB (ref 0.44–1.00)
Chloride: 109 mmol/L (ref 98–111)
GFR, EST AFRICAN AMERICAN: 21 mL/min — AB (ref >60)
GFR, EST NON AFRICAN AMERICAN: 18 mL/min — AB (ref >60)
Glucose, Bld: 58 mg/dL — ABNORMAL LOW (ref 70–99)
Potassium: 4.7 mmol/L (ref 3.5–5.1)
Sodium: 137 mmol/L (ref 135–145)
Total Protein: 6.2 g/dL — ABNORMAL LOW (ref 6.5–8.1)

## 2018-07-13 LAB — PHOSPHORUS: Phosphorus: 4.3 mg/dL (ref 2.5–4.6)

## 2018-07-13 LAB — CBC
HCT: 30.5 % — ABNORMAL LOW (ref 36.0–46.0)
Hemoglobin: 9.9 g/dL — ABNORMAL LOW (ref 12.0–15.0)
MCH: 29.8 pg (ref 26.0–34.0)
MCHC: 32.5 g/dL (ref 30.0–36.0)
MCV: 91.9 fL (ref 78.0–100.0)
PLATELETS: 296 10*3/uL (ref 150–400)
RBC: 3.32 MIL/uL — AB (ref 3.87–5.11)
RDW: 13.9 % (ref 11.5–15.5)
WBC: 5.5 10*3/uL (ref 4.0–10.5)

## 2018-07-13 LAB — RETICULOCYTES
RBC.: 3.32 MIL/uL — AB (ref 3.87–5.11)
RETIC CT PCT: 2.1 % (ref 0.4–3.1)
Retic Count, Absolute: 69.7 10*3/uL (ref 19.0–186.0)

## 2018-07-13 LAB — HEMOGLOBIN A1C
Hgb A1c MFr Bld: 9 % — ABNORMAL HIGH (ref 4.8–5.6)
Mean Plasma Glucose: 211.6 mg/dL

## 2018-07-13 LAB — MAGNESIUM: Magnesium: 2.2 mg/dL (ref 1.7–2.4)

## 2018-07-13 LAB — FERRITIN: Ferritin: 107 ng/mL (ref 11–307)

## 2018-07-13 LAB — ECHOCARDIOGRAM COMPLETE
HEIGHTINCHES: 63 in
WEIGHTICAEL: 2659.63 [oz_av]

## 2018-07-13 LAB — IRON AND TIBC
Iron: 85 ug/dL (ref 28–170)
SATURATION RATIOS: 34 % — AB (ref 10.4–31.8)
TIBC: 246 ug/dL — AB (ref 250–450)
UIBC: 161 ug/dL

## 2018-07-13 LAB — VITAMIN B12: VITAMIN B 12: 354 pg/mL (ref 180–914)

## 2018-07-13 LAB — TROPONIN I
TROPONIN I: 0.03 ng/mL — AB (ref <0.03)
Troponin I: <0.03 ng/mL (ref <0.03)

## 2018-07-13 LAB — TSH: TSH: 1.134 u[IU]/mL (ref 0.350–4.500)

## 2018-07-13 LAB — BRAIN NATRIURETIC PEPTIDE: B NATRIURETIC PEPTIDE 5: 337.3 pg/mL — AB (ref 0.0–100.0)

## 2018-07-13 LAB — FOLATE: Folate: 13.3 ng/mL (ref >5.9)

## 2018-07-13 MED ORDER — MAGNESIUM HYDROXIDE 400 MG/5ML PO SUSP
30.0000 mL | Freq: Every day | ORAL | Status: AC
  Administered 2018-07-13 – 2018-07-14 (×2): 30 mL via ORAL
  Filled 2018-07-13 (×2): qty 30

## 2018-07-13 MED ORDER — TAMSULOSIN HCL 0.4 MG PO CAPS
0.4000 mg | ORAL_CAPSULE | Freq: Every day | ORAL | Status: DC
  Administered 2018-07-13 – 2018-07-15 (×3): 0.4 mg via ORAL
  Filled 2018-07-13 (×3): qty 1

## 2018-07-13 MED ORDER — HYDROCERIN EX CREA
TOPICAL_CREAM | Freq: Every day | CUTANEOUS | Status: DC
  Administered 2018-07-15: 09:00:00 via TOPICAL
  Filled 2018-07-13: qty 113

## 2018-07-13 NOTE — Evaluation (Signed)
Occupational Therapy Evaluation Patient Details Name: [name] MRN: [id number] DOB: 05-22-1926 Today's Date: 07/13/2018    History of Present Illness Pt is a 84 y.o. F with significant PMH of HTN, DM, type 2, hx of gastroparesis, CKD, dementia who presents with possible syncope.   Clinical Impression   PTA, pt was living alone and was performing BADLs and light IADLs. Pt's daughter reporting pt has had two recent falls at home. Pt currently requiring Mod A for LB ADLs and Mod-Max A for functional mobility with RW. Pt presenting with decreased strength, balance, and safety awareness. Pt very pleasant and agreeable to participate in therapy. Pt would benefit from further acute OT to facilitate safe dc. Recommend dc to SNF for further OT to optimize safety, independence with ADLs, and return to PLOF.      Follow Up Recommendations  SNF;Supervision/Assistance - 24 hour    Equipment Recommendations  Other (comment)(Defer to next venue)    Recommendations for Other Services PT consult     Precautions / Restrictions Precautions Precautions: Fall Restrictions Weight Bearing Restrictions: No      Mobility Bed Mobility Overal bed mobility: Needs Assistance Bed Mobility: Supine to Sit     Supine to sit: Mod assist     General bed mobility comments: Pt in recliner upon arrival  Transfers Overall transfer level: Needs assistance Equipment used: Rolling walker (2 wheeled) Transfers: Sit to/from [employer] Sit to Stand: Mod assist;Max assist Stand pivot transfers: Mod assist;+2 safety/equipment       General transfer comment: Pt requiring Mod A for initial power up into standing and then to correct posterior lean. Pt requiring Max A as she fatigued and presented with increased posterior and left lateral lean. VCs throughout for RW management.     Balance Overall balance assessment: Needs assistance Sitting-balance support: Bilateral upper extremity  supported;Feet supported Sitting balance-Leahy Scale: Good     Standing balance support: Bilateral upper extremity supported Standing balance-Leahy Scale: Poor Standing balance comment: heavy posterior lean                           ADL either performed or assessed with clinical judgement   ADL Overall ADL's : Needs assistance/impaired Eating/Feeding: Set up;Supervision/ safety;Sitting;Cueing for sequencing   Grooming: Set up;Supervision/safety;Sitting;Cueing for sequencing   Upper Body Bathing: Minimal assistance;Sitting   Lower Body Bathing: Moderate assistance;Sit to/from stand   Upper Body Dressing : Minimal assistance;Sitting   Lower Body Dressing: Moderate assistance;Sit to/from stand Lower Body Dressing Details (indicate cue type and reason): Pt able to bend forward to adjust socks while seated. Min A for standing balance Toilet Transfer: Moderate assistance;Maximal assistance;+2 for safety/equipment;Ambulation;RW(Simulated to recliner)           Functional mobility during ADLs: Moderate assistance;Maximal assistance;+2 for physical assistance;+2 for safety/equipment;Rolling walker General ADL Comments: Pt presenting with decreased strength, balance, and safety awareness. Pt with significant posterior lean during standing and requiring mod A for postural correction.      Vision         Perception     Praxis      Pertinent Vitals/Pain Pain Assessment: No/denies pain     Hand Dominance Right   Extremity/Trunk Assessment Upper Extremity Assessment Upper Extremity Assessment: Generalized weakness   Lower Extremity Assessment Lower Extremity Assessment: Generalized weakness   Cervical / Trunk Assessment Cervical / Trunk Assessment: Other exceptions Cervical / Trunk Exceptions: increased body habitus   Communication Communication Communication: No  difficulties   Cognition Arousal/Alertness: Awake/alert Behavior During Therapy: WFL for tasks  assessed/performed Overall Cognitive Status: History of cognitive impairments - at baseline                                 General Comments: Dementia at baseline. patient oriented to self and place. presents with decreased short term memory and poor awareness of deficits.    General Comments  Daughter present throughout session    Exercises     Shoulder Instructions      Home Living Family/patient expects to be discharged to:: Private residence Living Arrangements: Alone Available Help at Discharge: Family;Available PRN/intermittently Type of Home: House Home Access: Level entry     Home Layout: One level     Bathroom Shower/Tub: Tub/shower unit(Sponge bathes)   Bathroom Toilet: Standard     Home Equipment: Cane - single point;Walker - 2 wheels   Additional Comments: Daughter providing information      Prior Functioning/Environment Level of Independence: Independent with assistive device(s)        Comments: Refuses to use DME for functional mobility. Pt performs BADLs, daily medication management, and light meal preparation tasks. Daughter does all IADLs.         OT Problem List: Decreased strength;Decreased range of motion;Decreased activity tolerance;Impaired balance (sitting and/or standing);Decreased safety awareness;Decreased knowledge of use of DME or AE;Decreased cognition;Decreased knowledge of precautions      OT Treatment/Interventions: Self-care/ADL training;Therapeutic exercise;Energy conservation;DME and/or AE instruction;Therapeutic activities;Patient/family education    OT Goals(Current goals can be found in the care plan section) Acute Rehab OT Goals Patient Stated Goal: "get stronger" OT Goal Formulation: With patient/family Time For Goal Achievement: 07/27/18 Potential to Achieve Goals: Good ADL Goals Pt Will Perform Grooming: with min guard assist;standing Pt Will Perform Lower Body Dressing: with min guard assist;sit to/from  stand Pt Will Transfer to Toilet: with min guard assist;ambulating;bedside commode Pt Will Perform Toileting - Clothing Manipulation and hygiene: with min guard assist;sit to/from stand  OT Frequency: Min 2X/week   Barriers to D/C:            Co-evaluation              AM-PAC PT "6 Clicks" Daily Activity     Outcome Measure Help from another person eating meals?: None Help from another person taking care of personal grooming?: A Little Help from another person toileting, which includes using toliet, bedpan, or urinal?: A Lot Help from another person bathing (including washing, rinsing, drying)?: A Lot Help from another person to put on and taking off regular upper body clothing?: A Little Help from another person to put on and taking off regular lower body clothing?: A Lot 6 Click Score: 16   End of Session Equipment Utilized During Treatment: Gait belt;Rolling walker Nurse Communication: Mobility status;Precautions  Activity Tolerance: Patient tolerated treatment well Patient left: in chair;with call bell/phone within reach;with family/visitor present  OT Visit Diagnosis: Unsteadiness on feet (R26.81);Other abnormalities of gait and mobility (R26.89);Muscle weakness (generalized) (M62.81);Other symptoms and signs involving cognitive function                Time: 1330-1351 OT Time Calculation (min): 21 min Charges:  OT General Charges $OT Visit: 1 Visit OT Evaluation $OT Eval Moderate Complexity: 1 Mod  [name] MSOT, OTR/L Acute Rehab Pager: [phone] Office: [phone]  Theodoro Grist [name] 07/13/2018, 4:31 PM

## 2018-07-13 NOTE — Progress Notes (Signed)
PT Cancellation Note  Patient Details Name: [name] MRN: [id number] DOB: [birth year]   Cancelled Treatment:    Reason Eval/Treat Not Completed: Patient at procedure or test/unavailable (echo).  Laurina Bustle, PT, DPT Acute Rehabilitation Services  Pager: [phone]    Vanetta Mulders 07/13/2018, 10:14 AM

## 2018-07-13 NOTE — Care Management (Signed)
Spoke with patient and daughter regarding d/c plans.  Daughter states they have decided to go to SNF, she has spoken with CSW, and she has selected a facility.  They are not interested in home health and seem to be more appropriate for SNF level care.

## 2018-07-13 NOTE — NC FL2 (Signed)
[place] MEDICAID FL2 LEVEL OF CARE SCREENING TOOL     IDENTIFICATION  Patient Name: [name] Birthdate: 09/28/1926 Sex: female Admission Date (Current Location): 07/12/2018  [facility] and [place] Number:  Producer, television/film/video and Address:  [facility] [place], [address] [place], [place] [address]      Provider Number: [id number]  Attending Physician Name and Address:  Joseph Art, DO  Relative Name and Phone Number:       Current Level of Care: Hospital Recommended Level of Care: Skilled Nursing Facility Prior Approval Number:    Date Approved/Denied:   PASRR Number:   [id number]   Discharge Plan: SNF    Current Diagnoses: Patient Active Problem List   Diagnosis Date Noted  . Abdominal distention 07/13/2018  . Syncope 07/12/2018  . Hyperkalemia 07/12/2018  . Dementia 07/12/2018  . Acute kidney injury (HCC) 01/01/2018  . Acute lower UTI 01/01/2018  . Acute renal failure (ARF) (HCC) 01/01/2018  . Hyponatremia 04/30/2012  . Routine general medical examination at a health care facility 05/09/2011  . Gastroparesis 07/05/2010  . INTERNAL HEMORRHOIDS WITHOUT MENTION COMP 05/29/2010  . DYSPEPSIA 03/02/2010  . CHRONIC KIDNEY DISEASE STAGE I 10/21/2009  . HEAT STROKE 06/07/2008  . Type 2 diabetes mellitus with other specified complication (HCC) 07/22/2007  . Hypercholesteremia 07/22/2007  . GOUT 07/22/2007  . Essential hypertension 07/22/2007    Orientation RESPIRATION BLADDER Height & Weight     Time, Situation, Place, Self  Normal Incontinent Weight: 75.4 kg(bed scale; pt unable to stand) Height:  5\' 3"  (160 cm)  BEHAVIORAL SYMPTOMS/MOOD NEUROLOGICAL BOWEL NUTRITION STATUS      Incontinent Diet(please see discharge summary. )  AMBULATORY STATUS COMMUNICATION OF NEEDS Skin   Extensive Assist Verbally Normal                       Personal Care Assistance Level of Assistance  Bathing, Feeding, Dressing Bathing Assistance:  Maximum assistance Feeding assistance: Limited assistance Dressing Assistance: Maximum assistance     Functional Limitations Info  Sight, Speech, Hearing Sight Info: Adequate Hearing Info: Adequate Speech Info: Adequate    SPECIAL CARE FACTORS FREQUENCY  PT (By licensed PT), OT (By licensed OT)     PT Frequency: 5 times a week  OT Frequency: 5 times a week             Contractures Contractures Info: Not present    Additional Factors Info  Code Status, Allergies Code Status Info: Full  Allergies Info: Ciprofloxacin, Diltiazem Hcl           Current Medications (07/13/2018):  This is the current hospital active medication list Current Facility-Administered Medications  Medication Dose Route Frequency Provider Last Rate Last Dose  . acetaminophen (TYLENOL) tablet 650 mg  650 mg Oral Q6H PRN Therisa Doyne, MD       Or  . acetaminophen (TYLENOL) suppository 650 mg  650 mg Rectal Q6H PRN Doutova, Anastassia, MD      . cloNIDine (CATAPRES) tablet 0.1 mg  0.1 mg Oral q morning - 10a Mosetta Anis, RPH   0.1 mg at 07/13/18 1023  . cloNIDine (CATAPRES) tablet 0.2 mg  0.2 mg Oral QPM Mosetta Anis, RPH   0.2 mg at 07/12/18 2205  . insulin aspart (novoLOG) injection 0-9 Units  0-9 Units Subcutaneous Q4H Therisa Doyne, MD   1 Units at 07/13/18 1304  . ondansetron (ZOFRAN) tablet 4 mg  4 mg Oral  Q6H PRN Therisa Doyne, MD       Or  . ondansetron (ZOFRAN) injection 4 mg  4 mg Intravenous Q6H PRN Doutova, Anastassia, MD      . pantoprazole (PROTONIX) EC tablet 40 mg  40 mg Oral Daily Doutova, Anastassia, MD   40 mg at 07/13/18 1023  . polyethylene glycol (MIRALAX / GLYCOLAX) packet 17 g  17 g Oral Daily PRN Doutova, Anastassia, MD      . senna (SENOKOT) tablet 8.6 mg  1 tablet Oral BID Therisa Doyne, MD   8.6 mg at 07/13/18 1023     Discharge Medications: Please see discharge summary for a list of discharge medications.  Relevant Imaging  Results:  Relevant Lab Results:   Additional Information [id number].  Robb Matar, LCSWA

## 2018-07-13 NOTE — Progress Notes (Signed)
   07/13/18 1500  Clinical Encounter Type  Visited With Patient and family together  Visit Type Social support;Spiritual support  Chaplain was walking down hall and Pt. Smiled and said hello. Chaplain stop in a had brief visit with patient and her daughter at bedside. Pt was very pleasant and shared her faith with chaplain. Chaplain provided social, and spiritual support to Pt. and daughter.

## 2018-07-13 NOTE — Evaluation (Signed)
Physical Therapy Evaluation Patient Details Name: [name] MRN: [id number] DOB: [birth year] Today's Date: 07/13/2018   History of Present Illness  Pt is a 84 y.o. F with significant PMH of HTN, DM, type 2, hx of gastroparesis, CKD, dementia who presents with possible syncope.  Clinical Impression  Pt admitted with above diagnosis. Pt currently with functional limitations due to the deficits listed below (see PT Problem List). Patient presenting with decreased functional mobility secondary to decreased activity tolerance, generalized weakness, poor balance, and cognitive deficits (pt daughter reports cognition is at baseline). Performing transfers from bed to chair with mod-maximal assistance and displays heavy posterior lean. Pt will benefit from skilled PT to increase their independence and safety with mobility to allow discharge to the venue listed below.      Follow Up Recommendations SNF    Equipment Recommendations  Other (comment)(defer to next venue)    Recommendations for Other Services       Precautions / Restrictions Precautions Precautions: Fall Restrictions Weight Bearing Restrictions: No      Mobility  Bed Mobility Overal bed mobility: Needs Assistance Bed Mobility: Supine to Sit     Supine to sit: Mod assist     General bed mobility comments: mod assist for use of bed pad to bring hips to edge of bed. patient requiring significant time and effort to progress to sitting edge of bed  Transfers Overall transfer level: Needs assistance Equipment used: Rolling walker (2 wheeled) Transfers: Sit to/from [employer] Sit to Stand: Mod assist;Max assist Stand pivot transfers: Mod assist;+2 safety/equipment       General transfer comment: Patient requirnig mod-maximal assistance to boost up to standing from elevated bed. Max multimodal cueing for hand positioning and for proper body mechanics. Demonstrates heavy posterior lean. ModA + 2 for stand  pivot from bed to chair with verbal cueing provided for movement initiation.  Minimal foot clearance noted and trunk flexed posture  Ambulation/Gait             General Gait Details: deferred  Stairs            Wheelchair Mobility    Modified Rankin (Stroke Patients Only)       Balance Overall balance assessment: Needs assistance Sitting-balance support: Bilateral upper extremity supported;Feet supported Sitting balance-Leahy Scale: Good     Standing balance support: Bilateral upper extremity supported Standing balance-Leahy Scale: Poor Standing balance comment: heavy posterior lean                             Pertinent Vitals/Pain Pain Assessment: No/denies pain    Home Living Family/patient expects to be discharged to:: Private residence Living Arrangements: Alone Available Help at Discharge: Family;Available PRN/intermittently Type of Home: House Home Access: Level entry     Home Layout: One level Home Equipment: Cane - single point;Walker - 2 wheels Additional Comments: Daughter providing information    Prior Function Level of Independence: Independent with assistive device(s)         Comments: Refuses to use DME for functional mobility. Pt performs BADLs, daily medication management, and light meal preparation tasks. Daughter does all IADLs.      Hand Dominance        Extremity/Trunk Assessment   Upper Extremity Assessment Upper Extremity Assessment: Generalized weakness    Lower Extremity Assessment Lower Extremity Assessment: Generalized weakness    Cervical / Trunk Assessment Cervical / Trunk Assessment: Other exceptions Cervical / Trunk Exceptions:  increased body habitus  Communication   Communication: No difficulties  Cognition Arousal/Alertness: Awake/alert Behavior During Therapy: WFL for tasks assessed/performed Overall Cognitive Status: History of cognitive impairments - at baseline                                  General Comments: Dementia at baseline. patient oriented to self and place. presents with decreased short term memory and poor awareness of deficits. Pt daughter reports she is at baseline.      General Comments General comments (skin integrity, edema, etc.): pt daughter present    Exercises     Assessment/Plan    PT Assessment Patient needs continued PT services  PT Problem List Decreased strength;Decreased activity tolerance;Decreased balance;Decreased mobility;Decreased cognition;Decreased safety awareness       PT Treatment Interventions DME instruction;Gait training;Therapeutic activities;Functional mobility training;Therapeutic exercise;Balance training;Patient/family education    PT Goals (Current goals can be found in the Care Plan section)  Acute Rehab PT Goals Patient Stated Goal: "get stronger" PT Goal Formulation: With patient/family Time For Goal Achievement: 07/27/18 Potential to Achieve Goals: Good    Frequency Min 2X/week   Barriers to discharge Decreased caregiver support      Co-evaluation               AM-PAC PT "6 Clicks" Daily Activity  Outcome Measure Difficulty turning over in bed (including adjusting bedclothes, sheets and blankets)?: A Lot Difficulty moving from lying on back to sitting on the side of the bed? : Unable Difficulty sitting down on and standing up from a chair with arms (e.g., wheelchair, bedside commode, etc,.)?: Unable Help needed moving to and from a bed to chair (including a wheelchair)?: A Lot Help needed walking in hospital room?: A Lot Help needed climbing 3-5 steps with a railing? : Total 6 Click Score: 9    End of Session Equipment Utilized During Treatment: Gait belt Activity Tolerance: Patient tolerated treatment well Patient left: in chair;with call bell/phone within reach;with nursing/sitter in room Nurse Communication: Mobility status PT Visit Diagnosis: Unsteadiness on feet (R26.81);Other  abnormalities of gait and mobility (R26.89);History of falling (Z91.81);Muscle weakness (generalized) (M62.81);Difficulty in walking, not elsewhere classified (R26.2)    Time: 4098-1191 PT Time Calculation (min) (ACUTE ONLY): 29 min   Charges:   PT Evaluation $PT Eval Moderate Complexity: 1 Mod PT Treatments $Therapeutic Activity: 8-22 mins        Laurina Bustle, PT, DPT Acute Rehabilitation Services  Pager: [phone]   Vanetta Mulders 07/13/2018, 2:26 PM

## 2018-07-13 NOTE — Progress Notes (Signed)
CSW provided pt and family member with SNF list. CSW expressed that CSW would follow up as needed with pt and family at bedside.   Claude Manges Kiyomi Pallo, MSW, LCSW-A Emergency Department Clinical Social Worker [phone]

## 2018-07-13 NOTE — Progress Notes (Signed)
While bladder scanning the patient pt has greater than in the bladder, pt then was told to void pt voided 700cc and re scanned there was a residual of 680 will notify hospitalist at this time.

## 2018-07-13 NOTE — Progress Notes (Signed)
Pt CBG 58, given breakfast tray and OJ, pt A&O per this hospital visit baseline. Pt. Being fed at this time by EMT with speech at bedside evaluating pt eating habits.

## 2018-07-13 NOTE — Evaluation (Signed)
Clinical/Bedside Swallow Evaluation Patient Details  Name: [name] MRN: [id number] Date of Birth: 06-24-1926  Today's Date: 07/13/2018 Time: SLP Start Time (ACUTE ONLY): 0750 SLP Stop Time (ACUTE ONLY): 0807 SLP Time Calculation (min) (ACUTE ONLY): 17 min  Past Medical History:  Past Medical History:  Diagnosis Date  . Arthritis    rt hip  . Chronic kidney disease, stage I   . Dyspepsia and other specified disorders of function of stomach   . Gout, unspecified   . Headache   . Heat stroke and sunstroke    pt states she doesn't remember this -04/30/12  . Hemorrhoid   . Other and unspecified hyperlipidemia   . Type II or unspecified type diabetes mellitus without mention of complication, not stated as uncontrolled   . Unspecified essential hypertension    Past Surgical History:  Past Surgical History:  Procedure Laterality Date  . COLON SURGERY    . TONSILLECTOMY     HPI:  84 y.o. female with medical history significant of  HTN, DM type 2, Hx of gastroparesis, CKD. Admitted for syncope in the setting of hyperkalemia.    Assessment / Plan / Recommendation Clinical Impression  Pt demonstrates normal swallow function in the setting of cognitive impairment. Pt is talkative and verbal and tactile cues are needed to encourage attention to PO given continuous conversation. Despite this, pt was able to consume adequate amount of meal without significant difficulty, no signs of aspiration. Will sign off and continue current diet texture.  SLP Visit Diagnosis: Dysphagia, unspecified (R13.10)    Aspiration Risk  Mild aspiration risk    Diet Recommendation Regular;Thin liquid   Liquid Administration via: Cup;Straw Medication Administration: Whole meds with liquid Supervision: Staff to assist with self feeding Compensations: Slow rate;Small sips/bites;Minimize environmental distractions Postural Changes: Seated upright at 90 degrees    Other  Recommendations Oral Care  Recommendations: Oral care BID   Follow up Recommendations 24 hour supervision/assistance      Frequency and Duration            Prognosis        Swallow Study   General HPI: 84 y.o. female with medical history significant of  HTN, DM type 2, Hx of gastroparesis, CKD. Admitted for syncope in the setting of hyperkalemia.  Type of Study: Bedside Swallow Evaluation Previous Swallow Assessment: none Diet Prior to this Study: Regular;Thin liquids Temperature Spikes Noted: No Respiratory Status: Room air History of Recent Intubation: No Behavior/Cognition: Alert;Cooperative;Requires cueing Oral Cavity Assessment: Within Functional Limits Oral Care Completed by SLP: No Oral Cavity - Dentition: Missing dentition Vision: Impaired for self-feeding Self-Feeding Abilities: Needs assist Patient Positioning: Upright in bed Baseline Vocal Quality: Normal Volitional Cough: Cognitively unable to elicit Volitional Swallow: Unable to elicit    Oral/Motor/Sensory Function Overall Oral Motor/Sensory Function: Within functional limits   Ice Chips Ice chips: Not tested   Thin Liquid Thin Liquid: Within functional limits Presentation: Straw    Nectar Thick Nectar Thick Liquid: Not tested   Honey Thick Honey Thick Liquid: Not tested   Puree Puree: Within functional limits Presentation: Spoon   Solid     Solid: Impaired Presentation: Spoon Oral Phase Impairments: Other (comment) Oral Phase Functional Implications: Prolonged oral transit      Eileen Croswell, Riley Nearing 07/13/2018,8:09 AM

## 2018-07-13 NOTE — Consult Note (Signed)
WOC Nurse wound consult note Reason for Consult:Patient with bilateral feet with poor hygiene, elongated toenails, partial thickness areas of tissue loss.  Heels are at rick for skin breakdown. Wound type: venous insufficiency Pressure Injury POA: N/A Measurement: scattered small (pinpoint) areas of skin loss from moisture (secondary to urine draining into shoes and onto socks) Wound TIW:PYKD, dry Drainage (amount, consistency, odor) none Periwound:skin with odor consistent with urine Dressing procedure/placement/frequency:Patient will be provided with skin care to feet once daily followed by application of Eucerin cream.  Will will obtain bilateral pressure redistribution heel boots and these will be used to prevent pressure injury to heels.  WOC nursing team will not follow, but will remain available to this patient, the nursing and medical teams.  Please re-consult if needed. Thanks, Ladona Mow, MSN, RN, GNP, [facility] [place]  Pager# [phone]

## 2018-07-13 NOTE — Progress Notes (Signed)
Inpatient Diabetes Program Recommendations  AACE/ADA: New Consensus Statement on Inpatient Glycemic Control (2015)  Target Ranges:  Prepandial:   less than 140 mg/dL      Peak postprandial:   less than 180 mg/dL (1-2 hours)      Critically ill patients:  140 - 180 mg/dL   Lab Results  Component Value Date   GLUCAP 141 (H) 07/13/2018   HGBA1C 9.0 (H) 07/13/2018    Review of Glycemic Control  Diabetes history: DM2 Outpatient Diabetes medications: Humalog 75/25 20 units bid (daughter has been injecting 40 units for low CBG and 10 units for high CBG) Current orders for Inpatient glycemic control: Novolog 0-9 units Q4h  Eating 75-100% of meals. HgbA1C - 9% - acceptable for age  Inpatient Diabetes Program Recommendations:     Change Novolog to 0-9 units tidwc. Would not restart 75/25 insulin.  Will speak with pt/family on 07/14/2018. Continue to follow.  Thank you. Ailene Ards, RD, LDN, CDE Inpatient Diabetes Coordinator [phone]

## 2018-07-13 NOTE — Progress Notes (Signed)
In room to assess patient.  Incontinence care provided and linen changed.  Repositioned for comfort.  Feet cleaned and applied eucerin cream.  Pressure redistribution heel boots applied per order.  purewick placed at this time.  Bed in lowest position with call bell within reach.  Bed alarm activated.  Encouraged to call for assistance as needed.

## 2018-07-13 NOTE — Progress Notes (Signed)
[facility] PROGRESS NOTE  Ki Luckman GNF:[id number] DOB: 1926-03-01 DOA: 07/12/2018 PCP: Adrian Prince, MD  Assessment/Plan: #1Syncope vs fall. Etiology uncertain. Patient with dementia and lives alone so difficult to obtain facts. It appears she has been mis-managing her diabetes due to dementia and/or confusion on instruction. I fear that education will not help in setting of dementia. Mri without acute findings, no s/sx infection, potassium elevated on admission otherwise no metabolic derangements. ekg with no acute changes. Trop 0.03 x2.  Echo with EF 65%, moderate LVH and grade 1 diastolic dysfunction. No events on tele -follow carotid dopplar -improved DM control -PT/OT -may need placement  #2.  Hyperkalemia. Resolved today.no events on tele. -monitor  #3. CHRONIC KIDNEY DISEASE STAGE I -creatinine a little above baseline at admission. Improving with gentle IV fluids. -avoid nephrotoxins -monitor urine output -recheck in am  #4. Essential hypertension. Fair control while in hospital. Hx non-compliance likely related to dementia. Home meds include lasix, clonidine, hydralazine. -continue clonidine -hold lasix -continue holding hydralazine as well for no  #5. Gastroparesis stable. Tolerating po  #6. Hypercholesteremia -  stable continue home medications  #7. Type 2 diabetes mellitus with chronic kidney disease - poor control A1c 9.0 up form 7.8 in Feb 2019.  home meds include Humalog 75/25. Family and patient seem confused on how to manage SSI. Have been giving high doses insulin for low sugars.  -SSI for optimal control -diabetes coordinator but I suspect education wont help with memory issues  #8. Hyperkalemia. Resolved today.Treated in the emergency department with IV fluids, calcium and bicarb  #9. Dementia - lives alone. Follow up with family and PCP regarding safety -PT/OT eval  #10. Hyponatremia - resolved today  #11. Abdominal distention/urinary  retention. On admission appeared to have distended bladder. Post void bladder scan with 600cc urine. Kub with moderate stool burden -will discontinue foley -flomax -continue bowel regimen  Code Status: full Family Communication: daughter at bedside Disposition Plan: needs placement or supervision   Consultants:  Wound care  Procedures:    Antibiotics:  none  HPI/Subjective: [name] is an 84 year old female with history of CKD, diabetes who presented to the ED after syncopal event.  Patient with normal vitals.  No fever.  Patient was found on the floor by her family about 2 to 3 hours after family had talked to her on the phone.  Patient did not remember the event.  She denied any mechanical aspect of the fall.  Patient was alert and awake and sitting up against the couch and family walked in.  She denied any bony tenderness, headaches, neck pain, chest pain, shortness of breath.  Patient lives by herself and has family help her with her medications.  Patient had normal blood sugar with EMS  Objective: Vitals:   07/13/18 0518 07/13/18 1030  BP: (!) 137/58 (!) 157/73  Pulse: 67 72  Resp: 17 18  Temp: 98.1 F (36.7 C) 98.1 F (36.7 C)  SpO2: 99% 98%    Intake/Output Summary (Last 24 hours) at 07/13/2018 1408 Last data filed at 07/13/2018 0537 Gross per 24 hour  Intake 620 ml  Output 2880 ml  Net -2260 ml   Filed Weights   07/12/18 2138 07/13/18 0518  Weight: 76.8 kg 75.4 kg    Exam:   General:  Sitting up in chair eating lunch. No acute distress  Cardiovascular: RRR no mgr no LE edema  Respiratory: normal effort BS distant but clear I hear no wheeze or crackles  Abdomen:  obese sluggish BS no guarding or rebounding  Musculoskeletal: joints without swelling/erythema.  Data Reviewed: Basic Metabolic Panel: Recent Labs  Lab 07/12/18 1649 07/12/18 2155 07/13/18 0735  NA 131* 133* 137  K 6.3* 5.4* 4.7  CL 102 104 109  CO2 20* 18* 22  GLUCOSE 310* 132*  58*  BUN 31* 30* 26*  CREATININE 2.53* 2.36* 2.25*  CALCIUM 9.3 9.3 8.8*  MG  --   --  2.2  PHOS  --   --  4.3   Liver Function Tests: Recent Labs  Lab 07/12/18 1649 07/13/18 0735  AST 22 21  ALT 14 13  ALKPHOS 101 87  BILITOT 0.8 0.6  PROT 7.3 6.2*  ALBUMIN 3.5 2.8*   No results for input(s): LIPASE, AMYLASE in the last 168 hours. No results for input(s): AMMONIA in the last 168 hours. CBC: Recent Labs  Lab 07/12/18 1649 07/13/18 0735  WBC 8.2 5.5  NEUTROABS 6.9  --   HGB 10.7* 9.9*  HCT 32.8* 30.5*  MCV 92.1 91.9  PLT 312 296   Cardiac Enzymes: Recent Labs  Lab 07/12/18 2145 07/13/18 0735 07/13/18 1237  TROPONINI <0.03 0.03* <0.03   BNP (last 3 results) Recent Labs    01/01/18 1901 07/12/18 1649  BNP 156.0* 337.3*    ProBNP (last 3 results) No results for input(s): PROBNP in the last 8760 hours.  CBG: Recent Labs  Lab 07/13/18 0009 07/13/18 0410 07/13/18 0735 07/13/18 0818 07/13/18 1251  GLUCAP 122* 203* 58* 131* 141*    No results found for this or any previous visit (from the past 240 hour(s)).   Studies: Dg Abd 1 View  Result Date: 07/12/2018 CLINICAL DATA:  84 year old female with abdominal pain. EXAM: ABDOMEN - 1 VIEW COMPARISON:  None. FINDINGS: Top-normal caliber loop of small bowel in the left lower quadrant measure 3 cm in diameter. There is moderate amount of stool and air throughout the colon. No free air. The osseous structures and soft tissues are grossly unremarkable. IMPRESSION: Top-normal caliber small bowel loop in the left hemiabdomen. Electronically Signed   By: Elgie Collard M.D.   On: 07/12/2018 23:29   Ct Head Wo Contrast  Result Date: 07/12/2018 CLINICAL DATA:  84 year old female found on the floor by her daughter today. Weakness. No injury. EXAM: CT HEAD WITHOUT CONTRAST CT CERVICAL SPINE WITHOUT CONTRAST TECHNIQUE: Multidetector CT imaging of the head and cervical spine was performed following the standard protocol  without intravenous contrast. Multiplanar CT image reconstructions of the cervical spine were also generated. COMPARISON:  Head CT 03/18/2015. FINDINGS: CT HEAD FINDINGS Brain: Patchy and confluent areas of decreased attenuation are noted throughout the deep and periventricular white matter of the cerebral hemispheres bilaterally, compatible with chronic microvascular ischemic disease. No evidence of acute infarction, hemorrhage, hydrocephalus, extra-axial collection or mass lesion/mass effect. Vascular: No hyperdense vessel or unexpected calcification. Skull: Normal. Negative for fracture or focal lesion. Sinuses/Orbits: No acute finding. Other: None. CT CERVICAL SPINE FINDINGS Alignment: Reversal of normal cervical lordosis centered at the level of C5, favored to be positional. Alignment is otherwise anatomic. Skull base and vertebrae: No acute fracture. No primary bone lesion or focal pathologic process. Soft tissues and spinal canal: No prevertebral fluid or swelling. No visible canal hematoma. Disc levels: Mild multilevel degenerative disc disease and facet arthropathy. Upper chest: Unremarkable. Other: None. IMPRESSION: 1. No evidence of significant acute traumatic injury to the skull, brain or cervical spine. 2. Chronic microvascular ischemic changes in the cerebral white matter,  as above. 3. Mild multilevel degenerative disc disease and cervical spondylosis, as above. Electronically Signed   By: Trudie Reed M.D.   On: 07/12/2018 16:56   Ct Cervical Spine Wo Contrast  Result Date: 07/12/2018 CLINICAL DATA:  84 year old female found on the floor by her daughter today. Weakness. No injury. EXAM: CT HEAD WITHOUT CONTRAST CT CERVICAL SPINE WITHOUT CONTRAST TECHNIQUE: Multidetector CT imaging of the head and cervical spine was performed following the standard protocol without intravenous contrast. Multiplanar CT image reconstructions of the cervical spine were also generated. COMPARISON:  Head CT  03/18/2015. FINDINGS: CT HEAD FINDINGS Brain: Patchy and confluent areas of decreased attenuation are noted throughout the deep and periventricular white matter of the cerebral hemispheres bilaterally, compatible with chronic microvascular ischemic disease. No evidence of acute infarction, hemorrhage, hydrocephalus, extra-axial collection or mass lesion/mass effect. Vascular: No hyperdense vessel or unexpected calcification. Skull: Normal. Negative for fracture or focal lesion. Sinuses/Orbits: No acute finding. Other: None. CT CERVICAL SPINE FINDINGS Alignment: Reversal of normal cervical lordosis centered at the level of C5, favored to be positional. Alignment is otherwise anatomic. Skull base and vertebrae: No acute fracture. No primary bone lesion or focal pathologic process. Soft tissues and spinal canal: No prevertebral fluid or swelling. No visible canal hematoma. Disc levels: Mild multilevel degenerative disc disease and facet arthropathy. Upper chest: Unremarkable. Other: None. IMPRESSION: 1. No evidence of significant acute traumatic injury to the skull, brain or cervical spine. 2. Chronic microvascular ischemic changes in the cerebral white matter, as above. 3. Mild multilevel degenerative disc disease and cervical spondylosis, as above. Electronically Signed   By: Trudie Reed M.D.   On: 07/12/2018 16:56   Mr Brain Wo Contrast  Result Date: 07/13/2018 CLINICAL DATA:  Found down, possible fall. Gait imbalance. History of dementia, hypertension and diabetes. EXAM: MRI HEAD WITHOUT CONTRAST TECHNIQUE: Multiplanar, multiecho pulse sequences of the brain and surrounding structures were obtained without intravenous contrast. COMPARISON:  CT HEAD July 12, 2018 FINDINGS: Moderately motion degraded examination.  Fast sequences utilized. INTRACRANIAL CONTENTS: No reduced diffusion to suggest acute ischemia. No susceptibility artifact to suggest hemorrhage. Patchy supratentorial pontine white matter FLAIR  T2 hyperintensities. Moderate parenchymal brain volume loss with somewhat disproportion mesial temporal lobe atrophy. Prominent basal ganglia perivascular spaces associated chronic small vessel ischemic changes. No suspicious parenchymal signal, masses, mass effect. No abnormal extra-axial fluid collections. No extra-axial masses. VASCULAR: Normal major intracranial vascular flow voids present at skull base. SKULL AND UPPER CERVICAL SPINE: No abnormal sellar expansion. No suspicious calvarial bone marrow signal. Craniocervical junction maintained. SINUSES/ORBITS: The mastoid air-cells and included paranasal sinuses are well-aerated.The included ocular globes and orbital contents are non-suspicious. OTHER: None. IMPRESSION: 1. No acute intracranial process on this moderately motion degraded examination. 2. Mesial temporal lobe atrophy associated with neuro degenerative syndromes. 3. Moderate chronic small vessel ischemic changes. Electronically Signed   By: Awilda Metro M.D.   On: 07/13/2018 00:06   Dg Chest Port 1 View  Result Date: 07/12/2018 CLINICAL DATA:  Syncope EXAM: PORTABLE CHEST 1 VIEW COMPARISON:  01/02/2018 chest radiograph. FINDINGS: Stable cardiomediastinal silhouette with mild cardiomegaly. No pneumothorax. No pleural effusion. Lungs appear clear, with no acute consolidative airspace disease and no pulmonary edema. IMPRESSION: Stable mild cardiomegaly without pulmonary edema. No active pulmonary disease. Electronically Signed   By: Delbert Phenix M.D.   On: 07/12/2018 17:15    Scheduled Meds: . cloNIDine  0.1 mg Oral q morning - 10a  . cloNIDine  0.2 mg Oral  QPM  . insulin aspart  0-9 Units Subcutaneous Q4H  . pantoprazole  40 mg Oral Daily  . senna  1 tablet Oral BID   Continuous Infusions:  Principal Problem:   Syncope Active Problems:   Type 2 diabetes mellitus with other specified complication (HCC)   Hyponatremia   Hyperkalemia   Essential hypertension   Gastroparesis    Dementia   Abdominal distention   Hypercholesteremia   CHRONIC KIDNEY DISEASE STAGE I    Time spent: 109   [facility] M  [facility]  If 7PM-7AM, please contact night-coverage at [email or web address], password [facility] 07/13/2018, 2:08 PM  LOS: 0 days

## 2018-07-13 NOTE — Care Management (Signed)
ED CM met with patient and daugter at bedside to discuss transitional care plan. Patient and daughter reports patient lives alone with increasing weaklness not they feel that patient will benefit from Skilled Nursing.  CM will consult with  ED CSW. CM will continue to follow for any further CM needs

## 2018-07-13 NOTE — Progress Notes (Signed)
CSW spoke with pt and daughter at bedside. CSW advised that pt is interested in [facility]. Pt gave CSW permission to send information out to facilities at this time. CSW spoke with family regarding barriers to placement today (facility does not admit over the weekend and holiday is tomorrow). Family and pt very understanding and agreeable to plan of care at this time.  CSW has followed up with [facility] at this time and left voicemail. CSW will continue to follow for further needs.   Claude Manges Conrad Zajkowski, MSW, LCSW-A Emergency Department Clinical Social Worker [phone]

## 2018-07-13 NOTE — Progress Notes (Signed)
OT Cancellation Note  Patient Details Name: [name] MRN: [id number] DOB: January 26, 1926   Cancelled Treatment:    Reason Eval/Treat Not Completed: Patient at procedure or test/ unavailable(ECHO. Will return as schedule allows)  Lilienne Weins M Embree Brawley Blakelee Allington MSOT, OTR/L Acute Rehab Pager: [phone] Office: [phone] 07/13/2018, 10:04 AM

## 2018-07-13 NOTE — Progress Notes (Signed)
VASCULAR LAB PRELIMINARY  PRELIMINARY  PRELIMINARY  PRELIMINARY  Carotid duplex completed.    Preliminary report:  1-39% ICA plaquing. Vertebral artery flow is antegrade.   Janeliz Prestwood, RVT 07/13/2018, 9:47 AM

## 2018-07-13 NOTE — Progress Notes (Signed)
  Echocardiogram 2D Echocardiogram has been performed.  [name] 07/13/2018, 9:48 AM

## 2018-07-13 NOTE — Progress Notes (Signed)
Pt transported to ECHO. 

## 2018-07-14 DIAGNOSIS — N184 Chronic kidney disease, stage 4 (severe): Secondary | ICD-10-CM

## 2018-07-14 DIAGNOSIS — E871 Hypo-osmolality and hyponatremia: Secondary | ICD-10-CM

## 2018-07-14 DIAGNOSIS — I10 Essential (primary) hypertension: Secondary | ICD-10-CM | POA: Diagnosis not present

## 2018-07-14 DIAGNOSIS — F0390 Unspecified dementia without behavioral disturbance: Secondary | ICD-10-CM | POA: Diagnosis not present

## 2018-07-14 DIAGNOSIS — R55 Syncope and collapse: Secondary | ICD-10-CM | POA: Diagnosis not present

## 2018-07-14 LAB — GLUCOSE, CAPILLARY
GLUCOSE-CAPILLARY: 100 mg/dL — AB (ref 70–99)
GLUCOSE-CAPILLARY: 181 mg/dL — AB (ref 70–99)
Glucose-Capillary: 158 mg/dL — ABNORMAL HIGH (ref 70–99)
Glucose-Capillary: 142 mg/dL — ABNORMAL HIGH (ref 70–99)
Glucose-Capillary: 166 mg/dL — ABNORMAL HIGH (ref 70–99)

## 2018-07-14 LAB — CBC
HCT: 30.9 % — ABNORMAL LOW (ref 36.0–46.0)
Hemoglobin: 10.1 g/dL — ABNORMAL LOW (ref 12.0–15.0)
MCH: 30.1 pg (ref 26.0–34.0)
MCHC: 32.7 g/dL (ref 30.0–36.0)
MCV: 92.2 fL (ref 78.0–100.0)
PLATELETS: 294 10*3/uL (ref 150–400)
RBC: 3.35 MIL/uL — AB (ref 3.87–5.11)
RDW: 13.9 % (ref 11.5–15.5)
WBC: 5 10*3/uL (ref 4.0–10.5)

## 2018-07-14 LAB — BASIC METABOLIC PANEL
ANION GAP: 6 (ref 5–15)
BUN: 26 mg/dL — ABNORMAL HIGH (ref 8–23)
CO2: 23 mmol/L (ref 22–32)
Calcium: 8.6 mg/dL — ABNORMAL LOW (ref 8.9–10.3)
Chloride: 108 mmol/L (ref 98–111)
Creatinine, Ser: 2.27 mg/dL — ABNORMAL HIGH (ref 0.44–1.00)
GFR, EST AFRICAN AMERICAN: 21 mL/min — AB (ref >60)
GFR, EST NON AFRICAN AMERICAN: 18 mL/min — AB (ref >60)
GLUCOSE: 170 mg/dL — AB (ref 70–99)
POTASSIUM: 4.9 mmol/L (ref 3.5–5.1)
Sodium: 137 mmol/L (ref 135–145)

## 2018-07-14 MED ORDER — INSULIN ASPART 100 UNIT/ML ~~LOC~~ SOLN
0.0000 [IU] | Freq: Three times a day (TID) | SUBCUTANEOUS | Status: DC
  Administered 2018-07-15 (×3): 2 [IU] via SUBCUTANEOUS

## 2018-07-14 NOTE — Care Management Obs Status (Signed)
MEDICARE OBSERVATION STATUS NOTIFICATION   Patient Details  Name: [name] MRN: [id number] Date of Birth: 03/10/1926   Medicare Observation Status Notification Given:  Yes    Colleen Can RN, BSN, NCM-BC, ACM-RN [phone] 07/14/2018, 11:40 AM

## 2018-07-14 NOTE — Progress Notes (Addendum)
[facility] PROGRESS NOTE  [name] [id number]:[id number] DOB: 01/18/1926 DOA: 07/12/2018 PCP: Adrian Prince, MD  84 y.o. female with medical history significant of dementia, HTN, DM type 2, Hx of gastroparesis, CKD.  Admitted for syncope in the setting of hyperkalemia. Plan is for SNF.  Await insurance authorization.   Assessment/Plan: Syncope vs fall. Etiology uncertain. Patient with dementia and lives alone so difficult to obtain facts. It appears she has been mis-managing her diabetes due to dementia and/or confusion on instruction. I fear that education will not help in setting of dementia. Mri without acute findings, no s/sx infection, potassium elevated on admission otherwise no metabolic derangements. ekg with no acute changes. Trop 0.03 x2.  Echo with EF 65%, moderate LVH and grade 1 diastolic dysfunction.  -PT/OT- SNF  CHRONIC KIDNEY DISEASE STAGE IV -monitor PRN   Essential hypertension. Fair control while in hospital. Hx non-compliance likely related to dementia. Home meds include lasix, clonidine, hydralazine. -continue clonidine -hold lasix -continue holding hydralazine as well for now  . Gastroparesis stable. Tolerating po   Hypercholesteremia -  stable continue home medications  Type 2 diabetes mellitus with chronic kidney disease  - poor control A1c 9.0 up from 7.8 in Feb 2019.  home meds include Humalog 75/25. Family and patient seem confused on how to manage SSI. Have been giving high doses insulin for low sugars.  -continue SSI here   Hyperkalemia.  Resolved   Dementia  -at baseline  Hyponatremia  -resolved with IVF   Abdominal distention/urinary retention. On admission appeared to have distended bladder. Post void bladder scan with 600cc urine. Kub with moderate stool burden -d/c foley -flomax -continue bowel regimen  Code Status: full Family Communication: daughter at bedside Disposition Plan: SNF   Consultants:  Wound  care  Procedures:    Antibiotics:  none  HPI/Subjective: No overnight events-- says everyone here has been treating her well  Objective: Vitals:   07/14/18 0438 07/14/18 0900  BP: (!) 158/60 140/70  Pulse: 60 (!) 59  Resp: 18 17  Temp: (!) 97.4 F (36.3 C) 98 F (36.7 C)  SpO2: 98% 100%    Intake/Output Summary (Last 24 hours) at 07/14/2018 1639 Last data filed at 07/14/2018 1500 Gross per 24 hour  Intake -  Output 1600 ml  Net -1600 ml   Filed Weights   07/12/18 2138 07/13/18 0518  Weight: 76.8 kg 75.4 kg    Exam:  In bed, NAD  Pleasant and cooperative  Feet dry skin and foul odor-- prevlon boots placed  +BS, soft  No increased work of breathing   Data Reviewed: Basic Metabolic Panel: Recent Labs  Lab 07/12/18 1649 07/12/18 2155 07/13/18 0735 07/14/18 0524  NA 131* 133* 137 137  K 6.3* 5.4* 4.7 4.9  CL 102 104 109 108  CO2 20* 18* 22 23  GLUCOSE 310* 132* 58* 170*  BUN 31* 30* 26* 26*  CREATININE 2.53* 2.36* 2.25* 2.27*  CALCIUM 9.3 9.3 8.8* 8.6*  MG  --   --  2.2  --   PHOS  --   --  4.3  --    Liver Function Tests: Recent Labs  Lab 07/12/18 1649 07/13/18 0735  AST 22 21  ALT 14 13  ALKPHOS 101 87  BILITOT 0.8 0.6  PROT 7.3 6.2*  ALBUMIN 3.5 2.8*   No results for input(s): LIPASE, AMYLASE in the last 168 hours. No results for input(s): AMMONIA in the last 168 hours. CBC: Recent Labs  Lab 07/12/18  1649 07/13/18 0735 07/14/18 0524  WBC 8.2 5.5 5.0  NEUTROABS 6.9  --   --   HGB 10.7* 9.9* 10.1*  HCT 32.8* 30.5* 30.9*  MCV 92.1 91.9 92.2  PLT 312 296 294   Cardiac Enzymes: Recent Labs  Lab 07/12/18 2145 07/13/18 0735 07/13/18 1237  TROPONINI <0.03 0.03* <0.03   BNP (last 3 results) Recent Labs    01/01/18 1901 07/12/18 1649  BNP 156.0* 337.3*    ProBNP (last 3 results) No results for input(s): PROBNP in the last 8760 hours.  CBG: Recent Labs  Lab 07/13/18 2346 07/14/18 0421 07/14/18 0753 07/14/18 1146  07/14/18 1622  GLUCAP 117* 158* 100* 142* 181*    No results found for this or any previous visit (from the past 240 hour(s)).   Studies: Dg Abd 1 View  Result Date: 07/12/2018 CLINICAL DATA:  84 year old female with abdominal pain. EXAM: ABDOMEN - 1 VIEW COMPARISON:  None. FINDINGS: Top-normal caliber loop of small bowel in the left lower quadrant measure 3 cm in diameter. There is moderate amount of stool and air throughout the colon. No free air. The osseous structures and soft tissues are grossly unremarkable. IMPRESSION: Top-normal caliber small bowel loop in the left hemiabdomen. Electronically Signed   By: Elgie Collard M.D.   On: 07/12/2018 23:29   Mr Brain Wo Contrast  Result Date: 07/13/2018 CLINICAL DATA:  Found down, possible fall. Gait imbalance. History of dementia, hypertension and diabetes. EXAM: MRI HEAD WITHOUT CONTRAST TECHNIQUE: Multiplanar, multiecho pulse sequences of the brain and surrounding structures were obtained without intravenous contrast. COMPARISON:  CT HEAD July 12, 2018 FINDINGS: Moderately motion degraded examination.  Fast sequences utilized. INTRACRANIAL CONTENTS: No reduced diffusion to suggest acute ischemia. No susceptibility artifact to suggest hemorrhage. Patchy supratentorial pontine white matter FLAIR T2 hyperintensities. Moderate parenchymal brain volume loss with somewhat disproportion mesial temporal lobe atrophy. Prominent basal ganglia perivascular spaces associated chronic small vessel ischemic changes. No suspicious parenchymal signal, masses, mass effect. No abnormal extra-axial fluid collections. No extra-axial masses. VASCULAR: Normal major intracranial vascular flow voids present at skull base. SKULL AND UPPER CERVICAL SPINE: No abnormal sellar expansion. No suspicious calvarial bone marrow signal. Craniocervical junction maintained. SINUSES/ORBITS: The mastoid air-cells and included paranasal sinuses are well-aerated.The included ocular globes  and orbital contents are non-suspicious. OTHER: None. IMPRESSION: 1. No acute intracranial process on this moderately motion degraded examination. 2. Mesial temporal lobe atrophy associated with neuro degenerative syndromes. 3. Moderate chronic small vessel ischemic changes. Electronically Signed   By: Awilda Metro M.D.   On: 07/13/2018 00:06   Dg Chest Port 1 View  Result Date: 07/12/2018 CLINICAL DATA:  Syncope EXAM: PORTABLE CHEST 1 VIEW COMPARISON:  01/02/2018 chest radiograph. FINDINGS: Stable cardiomediastinal silhouette with mild cardiomegaly. No pneumothorax. No pleural effusion. Lungs appear clear, with no acute consolidative airspace disease and no pulmonary edema. IMPRESSION: Stable mild cardiomegaly without pulmonary edema. No active pulmonary disease. Electronically Signed   By: Delbert Phenix M.D.   On: 07/12/2018 17:15    Scheduled Meds: . cloNIDine  0.1 mg Oral q morning - 10a  . cloNIDine  0.2 mg Oral QPM  . hydrocerin   Topical Daily  . insulin aspart  0-9 Units Subcutaneous TID WC  . pantoprazole  40 mg Oral Daily  . senna  1 tablet Oral BID  . tamsulosin  0.4 mg Oral QPC supper   Continuous Infusions:  Principal Problem:   Syncope Active Problems:   Type 2 diabetes mellitus  with other specified complication (HCC)   Hypercholesteremia   Essential hypertension   Gastroparesis   Hyponatremia   Hyperkalemia   Dementia   Abdominal distention    Time spent: 25   Joseph Art  [facility]  If 7PM-7AM, please contact night-coverage at [email or web address], password [facility] 07/14/2018, 4:39 PM  LOS: 0 days

## 2018-07-14 NOTE — Progress Notes (Signed)
Progress Note    [name]  ZOX:[id number] DOB: [birth year]  DOA: 07/12/2018 PCP: Adrian Prince, MD    Brief Narrative:   Chief complaint: Found down   Medical records reviewed and are as summarized below:  [name] is an 84 y.o. female with pmh CKD, IDDM, htn, gastropareis, dementia presents to ED after questionable syncopal episode. Daughter reports talking to pt on phone in am, but could not get a hold of her in the afternoon of 8/31. Pt lives alone. Daughter came to home to find pt leaning against couch, awake and alert. Pt uncertain of how she ended up on floor. Daughter did not check cbg, but did give her orange juice. Apparently, there has been confusion on insulin administration by both family and pt. Family have misunderstood instructions and will given 40 units whenever her blood sugars went down into 70s but only give 10 units when the blood sugar were elevated. Pt has difficulty with compliance given dementia.   Assessment/Plan:   Principal Problem:   Syncope Active Problems:   Type 2 diabetes mellitus with other specified complication (HCC)   Hypercholesteremia   Essential hypertension   Gastroparesis   CHRONIC KIDNEY DISEASE STAGE I   Hyponatremia   Hyperkalemia   Dementia   Abdominal distention   Body mass index is 29.45 kg/m.   Fall vs syncope -unclear etiology. Pt poor historian given dementia. Perhaps r/t chronic gait instability with dementia +/- hypoglycemic event given confusion on insulin administration -MRI with no acute findings, ekg NSR without evidence of acute ischemia, troponin neg x 3, carotid studies wnl, echo shows EF 65% with moderate LVH and grade I diastolic dysfunction -no events on telemetry -PT/OT recommending SNF. Pt does need 24 hour supervision for safety and medication administration to agree this would be the best route for her. Family is agreeable.  -Awaiting bed placement  Hyperkalemia, mild K+ 5.4>>4.9 -tx'd with  IVF, calcium and bicarb in ED  Urinary retention -bladder scan with 600cc urine on admit.  -KUB with moderate stool burden -likely element of neurogenic bladder with advanced age and dementia. Will continue flomax for now. No evidence of infection based on u/a  Constipation -per KUB -continue Senokot, Miralax   Skin breakdown -on heels, at feet secondary to poor hygeine -appreciate WOC nurse assisstance. Continue heel boots, skin care/eucerin cream  CKD, stage I  -Cr 2.27 which seems around baseline for her -monitor  IDDM -On SSI while inpt with good control  -questionable compliance with Humalog 75/25 piror to admit. A1C 9.0% from 7.8% in 12/2017  Hypertension -reasonable control with home dose of Clonidine -will continue holding lasix and hydralazine for now  Demenita -living alone prior to this admission which is a safety issue  -SNF at discharge. Family is agreeable    Family Communication/Anticipated D/C date and plan/Code Status   DVT prophylaxis: SCDs Code Status: Full Code.  Family Communication: aware of discharge planning Disposition Plan: SNF when bed available   Medical Consultants:    None.   Anti-Infectives:    None  Subjective:   Pleasantly confusing. No complaints.   Objective:    Vitals:   07/13/18 1812 07/14/18 0004 07/14/18 0438 07/14/18 0900  BP:  (!) 144/59 (!) 158/60 140/70  Pulse:  (!) 59 60 (!) 59  Resp:  18 18 17   Temp: 97.9 F (36.6 C)  (!) 97.4 F (36.3 C) 98 F (36.7 C)  TempSrc: Oral  Oral Oral  SpO2:  100% 98%  100%  Weight:      Height:        Intake/Output Summary (Last 24 hours) at 07/14/2018 1612 Last data filed at 07/14/2018 1500 Gross per 24 hour  Intake -  Output 1600 ml  Net -1600 ml   Filed Weights   07/12/18 2138 07/13/18 0518  Weight: 76.8 kg 75.4 kg    Exam: General: awake, alert sitting up in bed in NAD. Pleasantly confused CV: S1S2 RRR, no m/r/g Resp: decreased effort but CTA bilaterally  with no increased wob, no w/r/c GI: abdomen soft, seemingly nontender, BS+ Neuro: no focal m/s deficits on exam Skin: Heel boots in place. Chronic venous changes bilaterally to feet. Thick toenails   Data Reviewed:   I have personally reviewed following labs and imaging studies:  Labs: Labs show the following:   Basic Metabolic Panel: Recent Labs  Lab 07/12/18 1649 07/12/18 2155 07/13/18 0735 07/14/18 0524  NA 131* 133* 137 137  K 6.3* 5.4* 4.7 4.9  CL 102 104 109 108  CO2 20* 18* 22 23  GLUCOSE 310* 132* 58* 170*  BUN 31* 30* 26* 26*  CREATININE 2.53* 2.36* 2.25* 2.27*  CALCIUM 9.3 9.3 8.8* 8.6*  MG  --   --  2.2  --   PHOS  --   --  4.3  --    GFR Estimated Creatinine Clearance: 16.3 mL/min (A) (by C-G formula based on SCr of 2.27 mg/dL (H)). Liver Function Tests: Recent Labs  Lab 07/12/18 1649 07/13/18 0735  AST 22 21  ALT 14 13  ALKPHOS 101 87  BILITOT 0.8 0.6  PROT 7.3 6.2*  ALBUMIN 3.5 2.8*   No results for input(s): LIPASE, AMYLASE in the last 168 hours. No results for input(s): AMMONIA in the last 168 hours. Coagulation profile No results for input(s): INR, PROTIME in the last 168 hours.  CBC: Recent Labs  Lab 07/12/18 1649 07/13/18 0735 07/14/18 0524  WBC 8.2 5.5 5.0  NEUTROABS 6.9  --   --   HGB 10.7* 9.9* 10.1*  HCT 32.8* 30.5* 30.9*  MCV 92.1 91.9 92.2  PLT 312 296 294   Cardiac Enzymes: Recent Labs  Lab 07/12/18 2145 07/13/18 0735 07/13/18 1237  TROPONINI <0.03 0.03* <0.03   BNP (last 3 results) No results for input(s): PROBNP in the last 8760 hours. CBG: Recent Labs  Lab 07/13/18 2028 07/13/18 2346 07/14/18 0421 07/14/18 0753 07/14/18 1146  GLUCAP 140* 117* 158* 100* 142*   D-Dimer: No results for input(s): DDIMER in the last 72 hours. Hgb A1c: Recent Labs    07/13/18 0546  HGBA1C 9.0*   Lipid Profile: No results for input(s): CHOL, HDL, LDLCALC, TRIG, CHOLHDL, LDLDIRECT in the last 72 hours. Thyroid function  studies: Recent Labs    07/13/18 0735  TSH 1.134   Anemia work up: Recent Labs    07/13/18 0735  VITAMINB12 354  FOLATE 13.3  FERRITIN 107  TIBC 246*  IRON 85  RETICCTPCT 2.1   Sepsis Labs: Recent Labs  Lab 07/12/18 1649 07/12/18 2145 07/13/18 0735 07/14/18 0524  WBC 8.2  --  5.5 5.0  LATICACIDVEN  --  1.1  --   --     Microbiology No results found for this or any previous visit (from the past 240 hour(s)).  Procedures and diagnostic studies:  Dg Abd 1 View  Result Date: 07/12/2018 CLINICAL DATA:  84 year old female with abdominal pain. EXAM: ABDOMEN - 1 VIEW COMPARISON:  None. FINDINGS: Top-normal caliber loop of  small bowel in the left lower quadrant measure 3 cm in diameter. There is moderate amount of stool and air throughout the colon. No free air. The osseous structures and soft tissues are grossly unremarkable. IMPRESSION: Top-normal caliber small bowel loop in the left hemiabdomen. Electronically Signed   By: Elgie Collard M.D.   On: 07/12/2018 23:29   Ct Head Wo Contrast  Result Date: 07/12/2018 CLINICAL DATA:  84 year old female found on the floor by her daughter today. Weakness. No injury. EXAM: CT HEAD WITHOUT CONTRAST CT CERVICAL SPINE WITHOUT CONTRAST TECHNIQUE: Multidetector CT imaging of the head and cervical spine was performed following the standard protocol without intravenous contrast. Multiplanar CT image reconstructions of the cervical spine were also generated. COMPARISON:  Head CT 03/18/2015. FINDINGS: CT HEAD FINDINGS Brain: Patchy and confluent areas of decreased attenuation are noted throughout the deep and periventricular white matter of the cerebral hemispheres bilaterally, compatible with chronic microvascular ischemic disease. No evidence of acute infarction, hemorrhage, hydrocephalus, extra-axial collection or mass lesion/mass effect. Vascular: No hyperdense vessel or unexpected calcification. Skull: Normal. Negative for fracture or focal  lesion. Sinuses/Orbits: No acute finding. Other: None. CT CERVICAL SPINE FINDINGS Alignment: Reversal of normal cervical lordosis centered at the level of C5, favored to be positional. Alignment is otherwise anatomic. Skull base and vertebrae: No acute fracture. No primary bone lesion or focal pathologic process. Soft tissues and spinal canal: No prevertebral fluid or swelling. No visible canal hematoma. Disc levels: Mild multilevel degenerative disc disease and facet arthropathy. Upper chest: Unremarkable. Other: None. IMPRESSION: 1. No evidence of significant acute traumatic injury to the skull, brain or cervical spine. 2. Chronic microvascular ischemic changes in the cerebral white matter, as above. 3. Mild multilevel degenerative disc disease and cervical spondylosis, as above. Electronically Signed   By: Trudie Reed M.D.   On: 07/12/2018 16:56   Ct Cervical Spine Wo Contrast  Result Date: 07/12/2018 CLINICAL DATA:  84 year old female found on the floor by her daughter today. Weakness. No injury. EXAM: CT HEAD WITHOUT CONTRAST CT CERVICAL SPINE WITHOUT CONTRAST TECHNIQUE: Multidetector CT imaging of the head and cervical spine was performed following the standard protocol without intravenous contrast. Multiplanar CT image reconstructions of the cervical spine were also generated. COMPARISON:  Head CT 03/18/2015. FINDINGS: CT HEAD FINDINGS Brain: Patchy and confluent areas of decreased attenuation are noted throughout the deep and periventricular white matter of the cerebral hemispheres bilaterally, compatible with chronic microvascular ischemic disease. No evidence of acute infarction, hemorrhage, hydrocephalus, extra-axial collection or mass lesion/mass effect. Vascular: No hyperdense vessel or unexpected calcification. Skull: Normal. Negative for fracture or focal lesion. Sinuses/Orbits: No acute finding. Other: None. CT CERVICAL SPINE FINDINGS Alignment: Reversal of normal cervical lordosis centered  at the level of C5, favored to be positional. Alignment is otherwise anatomic. Skull base and vertebrae: No acute fracture. No primary bone lesion or focal pathologic process. Soft tissues and spinal canal: No prevertebral fluid or swelling. No visible canal hematoma. Disc levels: Mild multilevel degenerative disc disease and facet arthropathy. Upper chest: Unremarkable. Other: None. IMPRESSION: 1. No evidence of significant acute traumatic injury to the skull, brain or cervical spine. 2. Chronic microvascular ischemic changes in the cerebral white matter, as above. 3. Mild multilevel degenerative disc disease and cervical spondylosis, as above. Electronically Signed   By: Trudie Reed M.D.   On: 07/12/2018 16:56   Mr Brain Wo Contrast  Result Date: 07/13/2018 CLINICAL DATA:  Found down, possible fall. Gait imbalance. History of dementia,  hypertension and diabetes. EXAM: MRI HEAD WITHOUT CONTRAST TECHNIQUE: Multiplanar, multiecho pulse sequences of the brain and surrounding structures were obtained without intravenous contrast. COMPARISON:  CT HEAD July 12, 2018 FINDINGS: Moderately motion degraded examination.  Fast sequences utilized. INTRACRANIAL CONTENTS: No reduced diffusion to suggest acute ischemia. No susceptibility artifact to suggest hemorrhage. Patchy supratentorial pontine white matter FLAIR T2 hyperintensities. Moderate parenchymal brain volume loss with somewhat disproportion mesial temporal lobe atrophy. Prominent basal ganglia perivascular spaces associated chronic small vessel ischemic changes. No suspicious parenchymal signal, masses, mass effect. No abnormal extra-axial fluid collections. No extra-axial masses. VASCULAR: Normal major intracranial vascular flow voids present at skull base. SKULL AND UPPER CERVICAL SPINE: No abnormal sellar expansion. No suspicious calvarial bone marrow signal. Craniocervical junction maintained. SINUSES/ORBITS: The mastoid air-cells and included paranasal  sinuses are well-aerated.The included ocular globes and orbital contents are non-suspicious. OTHER: None. IMPRESSION: 1. No acute intracranial process on this moderately motion degraded examination. 2. Mesial temporal lobe atrophy associated with neuro degenerative syndromes. 3. Moderate chronic small vessel ischemic changes. Electronically Signed   By: Awilda Metro M.D.   On: 07/13/2018 00:06   Dg Chest Port 1 View  Result Date: 07/12/2018 CLINICAL DATA:  Syncope EXAM: PORTABLE CHEST 1 VIEW COMPARISON:  01/02/2018 chest radiograph. FINDINGS: Stable cardiomediastinal silhouette with mild cardiomegaly. No pneumothorax. No pleural effusion. Lungs appear clear, with no acute consolidative airspace disease and no pulmonary edema. IMPRESSION: Stable mild cardiomegaly without pulmonary edema. No active pulmonary disease. Electronically Signed   By: Delbert Phenix M.D.   On: 07/12/2018 17:15    Medications:   . cloNIDine  0.1 mg Oral q morning - 10a  . cloNIDine  0.2 mg Oral QPM  . hydrocerin   Topical Daily  . insulin aspart  0-9 Units Subcutaneous Q4H  . pantoprazole  40 mg Oral Daily  . senna  1 tablet Oral BID  . tamsulosin  0.4 mg Oral QPC supper   Continuous Infusions:    Cyndee Brightly, NP-C [facility]  pgr [phone]    *Please refer to [email or web address], password [email or web address] to get updated schedule on who will round on this patient, as hospitalists switch teams weekly. If 7PM-7AM, please contact night-coverage at [email or web address], password [email or web address] for any overnight needs.  07/14/2018, 4:12 PM

## 2018-07-14 NOTE — Progress Notes (Signed)
CSW spoke with Clydie Braun with [facility] to update he ron pt's interest in coming to the facility. Clydie Braun expressed that she would try and start auth today and follow up with CSW ASAP. CSW will continue to follow for any further needs.    Claude Manges Mariadel Mruk, MSW, LCSW-A Emergency Department Clinical Social Worker [phone]

## 2018-07-14 NOTE — Care Management Obs Status (Deleted)
MEDICARE OBSERVATION STATUS NOTIFICATION   Patient Details  Name: [name] MRN: [id number] Date of Birth: [birth year]   Medicare Observation Status Notification Given:  Yes    Colleen Can RN, BSN, NCM-BC, ACM-RN [phone] 07/14/2018, 9:45 AM

## 2018-07-15 DIAGNOSIS — K3184 Gastroparesis: Secondary | ICD-10-CM | POA: Diagnosis not present

## 2018-07-15 DIAGNOSIS — R55 Syncope and collapse: Secondary | ICD-10-CM | POA: Diagnosis not present

## 2018-07-15 DIAGNOSIS — I10 Essential (primary) hypertension: Secondary | ICD-10-CM | POA: Diagnosis not present

## 2018-07-15 DIAGNOSIS — F0390 Unspecified dementia without behavioral disturbance: Secondary | ICD-10-CM | POA: Diagnosis not present

## 2018-07-15 LAB — GLUCOSE, CAPILLARY
GLUCOSE-CAPILLARY: 181 mg/dL — AB (ref 70–99)
GLUCOSE-CAPILLARY: 152 mg/dL — AB (ref 70–99)
GLUCOSE-CAPILLARY: 160 mg/dL — AB (ref 70–99)

## 2018-07-15 MED ORDER — INSULIN ASPART 100 UNIT/ML ~~LOC~~ SOLN: 0.0000 [IU] | Freq: Three times a day (TID) | SUBCUTANEOUS | 11 refills | Status: AC

## 2018-07-15 MED ORDER — POLYETHYLENE GLYCOL 3350 17 G PO PACK: 17.0000 g | PACK | Freq: Every day | ORAL | 0 refills | Status: AC | PRN

## 2018-07-15 MED ORDER — HYDROCERIN EX CREA: 1.0000 "application " | TOPICAL_CREAM | Freq: Every day | CUTANEOUS | 0 refills | Status: AC

## 2018-07-15 NOTE — Clinical Social Work Placement (Signed)
   CLINICAL SOCIAL WORK PLACEMENT  NOTE  Date:  07/15/2018  Patient Details  Name: [name] MRN: [id number] Date of Birth: Mar 20, 1926  Clinical Social Work is seeking post-discharge placement for this patient at the Skilled  Nursing Facility level of care (*CSW will initial, date and re-position this form in  chart as items are completed):  Yes   Patient/family provided with [facility] Clinical Social Work Department's list of facilities offering this level of care within the geographic area requested by the patient (or if unable, by the patient's family).  Yes   Patient/family informed of their freedom to choose among providers that offer the needed level of care, that participate in Medicare, Medicaid or managed care program needed by the patient, have an available bed and are willing to accept the patient.  Yes   Patient/family informed of [facility]'s ownership interest in [facility] and [facility], as well as of the fact that they are under no obligation to receive care at these facilities.  PASRR submitted to EDS on 07/15/18     PASRR number received on 07/15/18     Existing PASRR number confirmed on       FL2 transmitted to all facilities in geographic area requested by pt/family on 07/15/18     FL2 transmitted to all facilities within larger geographic area on       Patient informed that his/her managed care company has contracts with or will negotiate with certain facilities, including the following:        Yes   Patient/family informed of bed offers received.  Patient chooses bed at [facility]     Physician recommends and patient chooses bed at      Patient to be transferred to [facility] on 07/15/18.  Patient to be transferred to facility by [facility]     Patient family notified on 07/15/18 of transfer.  Name of family member notified:  Daughter     PHYSICIAN       Additional Comment:     _______________________________________________ Mearl Latin, LCSWA 07/15/2018, 12:02 PM

## 2018-07-15 NOTE — Progress Notes (Signed)
Patient will DC to: [facility] Anticipated DC date:07/15/18 Family notified: Daughter in room Transport by: [facility] 2:30pm  Per MD patient ready for DC to [facility]. RN, patient, patient's family, and facility notified of DC. Discharge Summary sent to facility. RN given number for report: [phone] Room 117 B. DC packet on chart. Ambulance transport requested for patient.   CSW signing off.  Angeline Slim, LCSW

## 2018-07-15 NOTE — Discharge Summary (Signed)
Physician Discharge Summary  [name] ZOX:[id number] DOB: 05-23-1926 DOA: 07/12/2018  PCP: Adrian Prince, MD  Admit date: 07/12/2018 Discharge date: 07/15/2018  Admitted From: home Discharge disposition: SNF   Recommendations for Outpatient Follow-Up:   Patient will be provided with skin care to feet once daily followed by application of Eucerin cream.  Will will obtain bilateral pressure redistribution heel boots and these will be used to prevent pressure injury to heels. -monitor blood sugars closely Bowel regimen  Discharge Diagnosis:   Principal Problem:   Syncope Active Problems:   Type 2 diabetes mellitus with other specified complication (HCC)   Hypercholesteremia   Essential hypertension   Gastroparesis   Hyponatremia   Hyperkalemia   Dementia   Abdominal distention   CKD (chronic kidney disease), stage IV (HCC)    Discharge Condition: Improved.  Diet recommendation: Low sodium, heart healthy.  Carbohydrate-modified  Wound care: see above  Code status: Full.   History of Present Illness:   [name] is a 84 y.o. female with medical history significant of HTN, DM type 2, Hx of gastroparesis, CKD, dementia    Presented with   Possible syncope. Daughter was talking with mother on the phone today at 10 AM but was not able to get a hold of her by 2 PM daughter came home and found her leaning against a couch she was awake and conversant She is unsure how she ended up on the floor no pain no chest pain associated this. Patient has trouble walking has to lean on furniture  She lives alone with family near by. patient thinks she fell. The house was warm she has a habit of turning off [facility] Family did not check blood sugar but gave her  Orange juice  Family states they had been given her Humalog Mix 75/25 because patient herself stopped taking her insulin.  Family have misunderstood instructions and will given 40 units whenever her blood sugars  went down into 70s but only give 10 units when the blood sugar were elevated.  Discussed with family at length danger of hypoglycemia family states that they need help with education and proper management.  Noted that patient has self stopped taking her medications secondary to memory deficits.    Hospital Course by Problem:   Syncope vs fall. Etiology uncertain. Patient with dementia and lives alone so difficult to obtain facts. It appears she has been mis-managing her diabetes due to dementia and/or confusion on instruction.  - Mri without acute findings, no s/sx infection, potassium elevated on admission otherwise no metabolic derangements. ekg with no acute changes. Trop 0.03 x2.  Echo with EF 65%, moderate LVH and grade 1 diastolic dysfunction.  -PT/OT- SNF  CHRONIC KIDNEY DISEASE STAGE IV -monitor PRN   Essential hypertension.  - Hx non-compliance likely related to dementia. Home meds include lasix, clonidine, hydralazine. -continue clonidine -resume home med  Gastroparesisstable. Tolerating po   Hypercholesteremia-stable continue home medications  Type 2 diabetes mellitus withchronic kidney disease - poor control A1c 9.0 up from 7.8 in Feb 2019.  home meds include Humalog75/25. Family and patient seem confused on how to manage SSI. Have been giving high doses insulin for low sugars.  -continue SSI for now-- will need close management once discharged from SNF   Hyperkalemia.  Resolved   Dementia -at baseline  Hyponatremia -resolved with IVF   Abdominal distention/urinary retention. On admission appeared to have distended bladder. Post void bladder scan with 600cc urine. Kub with moderate  stool burden -d/c foley -continue bowel regimen     Medical Consultants:      Discharge Exam:   Vitals:   07/15/18 0631 07/15/18 0846  BP: (!) 149/52 (!) 169/72  Pulse: (!) 58   Resp: 18   Temp: 98 F (36.7 C)   SpO2: 100%    Vitals:   07/14/18 1835  07/14/18 2133 07/15/18 0631 07/15/18 0846  BP: (!) 145/57 136/63 (!) 149/52 (!) 169/72  Pulse: (!) 57 62 (!) 58   Resp: 16 18 18    Temp: 98.1 F (36.7 C) 98.1 F (36.7 C) 98 F (36.7 C)   TempSrc: Oral  Oral   SpO2: 100% 99% 100%   Weight: 78.7 kg     Height: 5\' 3"  (1.6 m)       General exam: Appears calm and comfortable. Pleasant and cooperative   The results of significant diagnostics from this hospitalization (including imaging, microbiology, ancillary and laboratory) are listed below for reference.     Procedures and Diagnostic Studies:   Dg Abd 1 View  Result Date: 07/12/2018 CLINICAL DATA:  84 year old female with abdominal pain. EXAM: ABDOMEN - 1 VIEW COMPARISON:  None. FINDINGS: Top-normal caliber loop of small bowel in the left lower quadrant measure 3 cm in diameter. There is moderate amount of stool and air throughout the colon. No free air. The osseous structures and soft tissues are grossly unremarkable. IMPRESSION: Top-normal caliber small bowel loop in the left hemiabdomen. Electronically Signed   By: Elgie Collard M.D.   On: 07/12/2018 23:29   Ct Head Wo Contrast  Result Date: 07/12/2018 CLINICAL DATA:  84 year old female found on the floor by her daughter today. Weakness. No injury. EXAM: CT HEAD WITHOUT CONTRAST CT CERVICAL SPINE WITHOUT CONTRAST TECHNIQUE: Multidetector CT imaging of the head and cervical spine was performed following the standard protocol without intravenous contrast. Multiplanar CT image reconstructions of the cervical spine were also generated. COMPARISON:  Head CT 03/18/2015. FINDINGS: CT HEAD FINDINGS Brain: Patchy and confluent areas of decreased attenuation are noted throughout the deep and periventricular white matter of the cerebral hemispheres bilaterally, compatible with chronic microvascular ischemic disease. No evidence of acute infarction, hemorrhage, hydrocephalus, extra-axial collection or mass lesion/mass effect. Vascular: No  hyperdense vessel or unexpected calcification. Skull: Normal. Negative for fracture or focal lesion. Sinuses/Orbits: No acute finding. Other: None. CT CERVICAL SPINE FINDINGS Alignment: Reversal of normal cervical lordosis centered at the level of C5, favored to be positional. Alignment is otherwise anatomic. Skull base and vertebrae: No acute fracture. No primary bone lesion or focal pathologic process. Soft tissues and spinal canal: No prevertebral fluid or swelling. No visible canal hematoma. Disc levels: Mild multilevel degenerative disc disease and facet arthropathy. Upper chest: Unremarkable. Other: None. IMPRESSION: 1. No evidence of significant acute traumatic injury to the skull, brain or cervical spine. 2. Chronic microvascular ischemic changes in the cerebral white matter, as above. 3. Mild multilevel degenerative disc disease and cervical spondylosis, as above. Electronically Signed   By: Trudie Reed M.D.   On: 07/12/2018 16:56   Ct Cervical Spine Wo Contrast  Result Date: 07/12/2018 CLINICAL DATA:  84 year old female found on the floor by her daughter today. Weakness. No injury. EXAM: CT HEAD WITHOUT CONTRAST CT CERVICAL SPINE WITHOUT CONTRAST TECHNIQUE: Multidetector CT imaging of the head and cervical spine was performed following the standard protocol without intravenous contrast. Multiplanar CT image reconstructions of the cervical spine were also generated. COMPARISON:  Head CT 03/18/2015. FINDINGS: CT HEAD  FINDINGS Brain: Patchy and confluent areas of decreased attenuation are noted throughout the deep and periventricular white matter of the cerebral hemispheres bilaterally, compatible with chronic microvascular ischemic disease. No evidence of acute infarction, hemorrhage, hydrocephalus, extra-axial collection or mass lesion/mass effect. Vascular: No hyperdense vessel or unexpected calcification. Skull: Normal. Negative for fracture or focal lesion. Sinuses/Orbits: No acute finding.  Other: None. CT CERVICAL SPINE FINDINGS Alignment: Reversal of normal cervical lordosis centered at the level of C5, favored to be positional. Alignment is otherwise anatomic. Skull base and vertebrae: No acute fracture. No primary bone lesion or focal pathologic process. Soft tissues and spinal canal: No prevertebral fluid or swelling. No visible canal hematoma. Disc levels: Mild multilevel degenerative disc disease and facet arthropathy. Upper chest: Unremarkable. Other: None. IMPRESSION: 1. No evidence of significant acute traumatic injury to the skull, brain or cervical spine. 2. Chronic microvascular ischemic changes in the cerebral white matter, as above. 3. Mild multilevel degenerative disc disease and cervical spondylosis, as above. Electronically Signed   By: Trudie Reed M.D.   On: 07/12/2018 16:56   Mr Brain Wo Contrast  Result Date: 07/13/2018 CLINICAL DATA:  Found down, possible fall. Gait imbalance. History of dementia, hypertension and diabetes. EXAM: MRI HEAD WITHOUT CONTRAST TECHNIQUE: Multiplanar, multiecho pulse sequences of the brain and surrounding structures were obtained without intravenous contrast. COMPARISON:  CT HEAD July 12, 2018 FINDINGS: Moderately motion degraded examination.  Fast sequences utilized. INTRACRANIAL CONTENTS: No reduced diffusion to suggest acute ischemia. No susceptibility artifact to suggest hemorrhage. Patchy supratentorial pontine white matter FLAIR T2 hyperintensities. Moderate parenchymal brain volume loss with somewhat disproportion mesial temporal lobe atrophy. Prominent basal ganglia perivascular spaces associated chronic small vessel ischemic changes. No suspicious parenchymal signal, masses, mass effect. No abnormal extra-axial fluid collections. No extra-axial masses. VASCULAR: Normal major intracranial vascular flow voids present at skull base. SKULL AND UPPER CERVICAL SPINE: No abnormal sellar expansion. No suspicious calvarial bone marrow signal.  Craniocervical junction maintained. SINUSES/ORBITS: The mastoid air-cells and included paranasal sinuses are well-aerated.The included ocular globes and orbital contents are non-suspicious. OTHER: None. IMPRESSION: 1. No acute intracranial process on this moderately motion degraded examination. 2. Mesial temporal lobe atrophy associated with neuro degenerative syndromes. 3. Moderate chronic small vessel ischemic changes. Electronically Signed   By: Awilda Metro M.D.   On: 07/13/2018 00:06   Dg Chest Port 1 View  Result Date: 07/12/2018 CLINICAL DATA:  Syncope EXAM: PORTABLE CHEST 1 VIEW COMPARISON:  01/02/2018 chest radiograph. FINDINGS: Stable cardiomediastinal silhouette with mild cardiomegaly. No pneumothorax. No pleural effusion. Lungs appear clear, with no acute consolidative airspace disease and no pulmonary edema. IMPRESSION: Stable mild cardiomegaly without pulmonary edema. No active pulmonary disease. Electronically Signed   By: Delbert Phenix M.D.   On: 07/12/2018 17:15     Labs:   Basic Metabolic Panel: Recent Labs  Lab 07/12/18 1649 07/12/18 2155 07/13/18 0735 07/14/18 0524  NA 131* 133* 137 137  K 6.3* 5.4* 4.7 4.9  CL 102 104 109 108  CO2 20* 18* 22 23  GLUCOSE 310* 132* 58* 170*  BUN 31* 30* 26* 26*  CREATININE 2.53* 2.36* 2.25* 2.27*  CALCIUM 9.3 9.3 8.8* 8.6*  MG  --   --  2.2  --   PHOS  --   --  4.3  --    GFR Estimated Creatinine Clearance: 16.7 mL/min (A) (by C-G formula based on SCr of 2.27 mg/dL (H)). Liver Function Tests: Recent Labs  Lab 07/12/18 1649 07/13/18 1610  AST 22 21  ALT 14 13  ALKPHOS 101 87  BILITOT 0.8 0.6  PROT 7.3 6.2*  ALBUMIN 3.5 2.8*   No results for input(s): LIPASE, AMYLASE in the last 168 hours. No results for input(s): AMMONIA in the last 168 hours. Coagulation profile No results for input(s): INR, PROTIME in the last 168 hours.  CBC: Recent Labs  Lab 07/12/18 1649 07/13/18 0735 07/14/18 0524  WBC 8.2 5.5 5.0    NEUTROABS 6.9  --   --   HGB 10.7* 9.9* 10.1*  HCT 32.8* 30.5* 30.9*  MCV 92.1 91.9 92.2  PLT 312 296 294   Cardiac Enzymes: Recent Labs  Lab 07/12/18 2145 07/13/18 0735 07/13/18 1237  TROPONINI <0.03 0.03* <0.03   BNP: Invalid input(s): POCBNP CBG: Recent Labs  Lab 07/14/18 0753 07/14/18 1146 07/14/18 1622 07/14/18 2135 07/15/18 0800  GLUCAP 100* 142* 181* 166* 181*   D-Dimer No results for input(s): DDIMER in the last 72 hours. Hgb A1c Recent Labs    07/13/18 0546  HGBA1C 9.0*   Lipid Profile No results for input(s): CHOL, HDL, LDLCALC, TRIG, CHOLHDL, LDLDIRECT in the last 72 hours. Thyroid function studies Recent Labs    07/13/18 0735  TSH 1.134   Anemia work up Recent Labs    07/13/18 0735  VITAMINB12 354  FOLATE 13.3  FERRITIN 107  TIBC 246*  IRON 85  RETICCTPCT 2.1   Microbiology No results found for this or any previous visit (from the past 240 hour(s)).   Discharge Instructions:   Discharge Instructions    Diet - low sodium heart healthy   Complete by:  As directed    Diet Carb Modified   Complete by:  As directed    Increase activity slowly   Complete by:  As directed      Allergies as of 07/15/2018      Reactions   Ciprofloxacin Other (See Comments)   Unknown reaction   Diltiazem Hcl Other (See Comments)   Unknown reaction      Medication List    STOP taking these medications   furosemide 40 MG tablet Commonly known as:  LASIX   HUMALOG MIX 75/25 KWIKPEN (75-25) 100 UNIT/ML Kwikpen Generic drug:  Insulin Lispro Prot & Lispro     TAKE these medications   amLODipine 10 MG tablet Commonly known as:  NORVASC Take 1 tablet (10 mg total) by mouth daily.   cloNIDine 0.1 MG tablet Commonly known as:  CATAPRES Take 0.1-0.2 mg by mouth See admin instructions. Take one tablet (0.1 mg) by mouth every morning and 2 tablets (0.2 mg) at night   hydrALAZINE 25 MG tablet Commonly known as:  APRESOLINE Take 25 mg by mouth 3  (three) times daily.   hydrocerin Crea Apply 1 application topically daily. Start taking on:  07/16/2018   insulin aspart 100 UNIT/ML injection Commonly known as:  novoLOG Inject 0-9 Units into the skin 3 (three) times daily with meals.   omeprazole 20 MG capsule Commonly known as:  PRILOSEC Take 40 mg by mouth daily.   polyethylene glycol packet Commonly known as:  MIRALAX / GLYCOLAX Take 17 g by mouth daily as needed for mild constipation.         Time coordinating discharge: 25 min  Signed:  Joseph Art  [facility] 07/15/2018, 11:13 AM

## 2018-07-15 NOTE — Progress Notes (Signed)
Patient to be discharged to [facility]. Report called to [facility]. Currently waiting on PTAR.

## 2018-07-15 NOTE — Progress Notes (Signed)
Pt admitted to [facility] room 18 from [facility]. Call bell within reach. All questions/concerns addressed. Will continue to monitor.

## 2018-07-15 NOTE — Progress Notes (Signed)
Occupational Therapy Treatment Patient Details Name: [name] MRN: [id number] DOB: 07-Dec-1925 Today's Date: 07/15/2018    History of present illness Pt is a 84 y.o. F with significant PMH of HTN, DM, type 2, hx of gastroparesis, CKD, dementia who presents with possible syncope.   OT comments  Pt very pleasant. Agreeable to sitting up/standing to increase strength for adls. Water was turned off; did not perform any adl activities this session  Follow Up Recommendations  SNF    Equipment Recommendations       Recommendations for Other Services      Precautions / Restrictions Precautions Precautions: Fall Restrictions Weight Bearing Restrictions: No       Mobility Bed Mobility         Supine to sit: Min assist Sit to supine: Min guard   General bed mobility comments: assist for trunk to sit up; pt got herself to lying without assistance  Transfers   Equipment used: Rolling walker (2 wheeled)   Sit to Stand: Min assist;Mod assist         General transfer comment: L foot blocked; HOB raised    Balance             Standing balance-Leahy Scale: Poor                             ADL either performed or assessed with clinical judgement   ADL                                         General ADL Comments: Pt sat EOB for UE exercises and to work on sit to stand as a precursor for adls:  Min to mod A for sit to stand from elevated bed.  Less posterior lean each time. Sidestepped up [facility] with min A on last trial to reposition in bed     Vision       Perception     Praxis      Cognition Arousal/Alertness: Awake/alert Behavior During Therapy: WFL for tasks assessed/performed Overall Cognitive Status: History of cognitive impairments - at baseline                                 General Comments: Dementia at baseline. patient oriented to self and place. presents with decreased short term memory and poor  awareness of deficits.         Exercises     Shoulder Instructions       General Comments      Pertinent Vitals/ Pain       Pain Assessment: No/denies pain  Home Living                                          Prior Functioning/Environment              Frequency  Min 2X/week        Progress Toward Goals  OT Goals(current goals can now be found in the care plan section)  Progress towards OT goals: Progressing toward goals     Plan      Co-evaluation  AM-PAC PT "6 Clicks" Daily Activity     Outcome Measure   Help from another person eating meals?: None Help from another person taking care of personal grooming?: A Little Help from another person toileting, which includes using toliet, bedpan, or urinal?: A Lot Help from another person bathing (including washing, rinsing, drying)?: A Lot Help from another person to put on and taking off regular upper body clothing?: A Little Help from another person to put on and taking off regular lower body clothing?: A Lot 6 Click Score: 16    End of Session Equipment Utilized During Treatment: Gait belt;Rolling walker  OT Visit Diagnosis: Unsteadiness on feet (R26.81);Other abnormalities of gait and mobility (R26.89);Muscle weakness (generalized) (M62.81);Other symptoms and signs involving cognitive function   Activity Tolerance Patient tolerated treatment well   Patient Left in bed;with call bell/phone within reach;with bed alarm set;with family/visitor present   Nurse Communication          Time: 5686-1683 OT Time Calculation (min): 32 min  Charges: OT General Charges $OT Visit: 1 Visit OT Treatments $Therapeutic Activity: 23-37 mins  Marica Otter, OTR/L [phone] 07/15/2018   [name] 07/15/2018, 11:30 AM

## 2018-07-15 NOTE — Progress Notes (Signed)
Patient has received insurance authorization to discharge to [facility].   Osborne Casco Aladdin Kollmann LCSW [phone]

## 2022-06-03 ENCOUNTER — Encounter (HOSPITAL_COMMUNITY): Payer: Self-pay

## 2022-06-03 ENCOUNTER — Emergency Department (HOSPITAL_COMMUNITY)
Admission: EM | Admit: 2022-06-03 | Discharge: 2022-06-03 | Disposition: A | Discharge status: Home / Self Care | Payer: Medicare Other | Attending: Emergency Medicine | Admitting: Emergency Medicine

## 2022-06-03 ENCOUNTER — Other Ambulatory Visit: Payer: Self-pay

## 2022-06-03 DIAGNOSIS — E162 Hypoglycemia, unspecified: Secondary | ICD-10-CM | POA: Diagnosis present

## 2022-06-03 DIAGNOSIS — E11649 Type 2 diabetes mellitus with hypoglycemia without coma: Secondary | ICD-10-CM | POA: Insufficient documentation

## 2022-06-03 DIAGNOSIS — Z794 Long term (current) use of insulin: Secondary | ICD-10-CM | POA: Insufficient documentation

## 2022-06-03 DIAGNOSIS — F0390 Unspecified dementia without behavioral disturbance: Secondary | ICD-10-CM | POA: Insufficient documentation

## 2022-06-03 LAB — CBC WITH DIFFERENTIAL/PLATELET
Abs Immature Granulocytes: 0.03 10*3/uL (ref 0.00–0.07)
Basophils Absolute: 0 10*3/uL (ref 0.0–0.1)
Basophils Relative: 1 %
Eosinophils Absolute: 0.2 10*3/uL (ref 0.0–0.5)
Eosinophils Relative: 2 %
HCT: 30.4 % — ABNORMAL LOW (ref 36.0–46.0)
Hemoglobin: 9.7 g/dL — ABNORMAL LOW (ref 12.0–15.0)
Immature Granulocytes: 0 %
Lymphocytes Relative: 24 %
Lymphs Abs: 2.1 10*3/uL (ref 0.7–4.0)
MCH: 29.5 pg (ref 26.0–34.0)
MCHC: 31.9 g/dL (ref 30.0–36.0)
MCV: 92.4 fL (ref 80.0–100.0)
Monocytes Absolute: 0.5 10*3/uL (ref 0.1–1.0)
Monocytes Relative: 6 %
Neutro Abs: 5.9 10*3/uL (ref 1.7–7.7)
Neutrophils Relative %: 67 %
Platelets: 264 10*3/uL (ref 150–400)
RBC: 3.29 MIL/uL — ABNORMAL LOW (ref 3.87–5.11)
RDW: 14.6 % (ref 11.5–15.5)
WBC: 8.7 10*3/uL (ref 4.0–10.5)
nRBC: 0 % (ref 0.0–0.2)

## 2022-06-03 LAB — COMPREHENSIVE METABOLIC PANEL
ALT: 11 U/L (ref 0–44)
AST: 17 U/L (ref 15–41)
Albumin: 2.3 g/dL — ABNORMAL LOW (ref 3.5–5.0)
Alkaline Phosphatase: 114 U/L (ref 38–126)
Anion gap: 8 (ref 5–15)
BUN: 53 mg/dL — ABNORMAL HIGH (ref 8–23)
CO2: 22 mmol/L (ref 22–32)
Calcium: 8.5 mg/dL — ABNORMAL LOW (ref 8.9–10.3)
Chloride: 103 mmol/L (ref 98–111)
Creatinine, Ser: 4.22 mg/dL — ABNORMAL HIGH (ref 0.44–1.00)
GFR, Estimated: 9 mL/min — ABNORMAL LOW (ref >60)
Glucose, Bld: 321 mg/dL — ABNORMAL HIGH (ref 70–99)
Potassium: 3.5 mmol/L (ref 3.5–5.1)
Sodium: 133 mmol/L — ABNORMAL LOW (ref 135–145)
Total Bilirubin: 0.4 mg/dL (ref 0.3–1.2)
Total Protein: 6.4 g/dL — ABNORMAL LOW (ref 6.5–8.1)

## 2022-06-03 LAB — CBG MONITORING, ED
Glucose-Capillary: 45 mg/dL — ABNORMAL LOW (ref 70–99)
Glucose-Capillary: 135 mg/dL — ABNORMAL HIGH (ref 70–99)
Glucose-Capillary: 98 mg/dL (ref 70–99)
Glucose-Capillary: 90 mg/dL (ref 70–99)
Glucose-Capillary: 80 mg/dL (ref 70–99)

## 2022-06-03 MED ORDER — DEXTROSE 50 % IV SOLN: 1.0000 | Freq: Once | INTRAVENOUS | Status: AC

## 2022-06-03 MED ORDER — DEXTROSE 50 % IV SOLN
INTRAVENOUS | Status: AC
  Administered 2022-06-03: 50 mL via INTRAVENOUS
  Filled 2022-06-03: qty 50

## 2022-06-03 NOTE — ED Notes (Signed)
Family at bedside. 

## 2022-06-03 NOTE — ED Provider Notes (Signed)
[facility]-EMERGENCY DEPT Provider Note   CSN: [id number] Arrival date & time: 06/03/22  9379     History  Chief Complaint  Patient presents with   Hypoglycemia    Stephanny Tsutsui is a 88 y.o. female.  Patient with history of dementia, brought in by family concern for not acting right.  She was less responsive yesterday and today.  Upon EMS arrival her blood sugar was in the 50s.  Family states that they continue to give her insulin despite patient's decreased oral intake.  Upon being given some D10 by EMS patient appears more awake and alert.  No other reports of fevers or cough or vomiting or diarrhea per family.  Patient herself denies any pain.       Home Medications Prior to Admission medications   Medication Sig Start Date End Date Taking? Authorizing Provider  amLODipine (NORVASC) 10 MG tablet Take 1 tablet (10 mg total) by mouth daily. 10/18/15  Yes Etta Grandchild, MD  cloNIDine (CATAPRES) 0.1 MG tablet Take 0.1-0.2 mg by mouth See admin instructions. Take one tablet (0.1 mg) by mouth every morning and 2 tablets (0.2 mg) at night   Yes [provider]  furosemide (LASIX) 20 MG tablet Take 20 mg by mouth daily. 05/08/22  Yes [provider]  hydrALAZINE (APRESOLINE) 25 MG tablet Take 25 mg by mouth 3 (three) times daily. 12/13/17  Yes [provider]  Insulin Lispro Prot & Lispro (HUMALOG MIX 75/25 KWIKPEN) (75-25) 100 UNIT/ML Kwikpen ADMINISTER 22 UNITS PRIOR TO BREAKFAST, 12 UNITS WITH LUNCH AND 10 UNITS IN WITH DINNER Subcutaneous as directed for 90 days   Yes [provider]  magnesium hydroxide (MILK OF MAGNESIA) 400 MG/5ML suspension Take 15 mLs by mouth daily as needed for mild constipation.   Yes [provider]  omeprazole (PRILOSEC) 20 MG capsule Take 40 mg by mouth daily.  10/06/17  Yes [provider]  hydrocerin (EUCERIN) CREA Apply 1 application topically daily. 07/16/18   Joseph Art, DO   insulin aspart (NOVOLOG) 100 UNIT/ML injection Inject 0-9 Units into the skin 3 (three) times daily with meals. Patient not taking: Reported on 06/03/2022 07/15/18   Joseph Art, DO  polyethylene glycol [facility] / GLYCOLAX) packet Take 17 g by mouth daily as needed for mild constipation. Patient not taking: Reported on 06/03/2022 07/15/18   Joseph Art, DO  Vitamin D, Ergocalciferol, (DRISDOL) 1.25 MG (50000 UNIT) CAPS capsule Take 50,000 Units by mouth once a week. Mondays 03/19/22   [provider]  simvastatin (ZOCOR) 40 MG tablet Take 20 mg by mouth at bedtime.    01/29/12  [provider]      Allergies    Ciprofloxacin and Diltiazem hcl    Review of Systems   Review of Systems  Unable to perform ROS: Dementia    Physical Exam Updated Vital Signs BP (!) 144/80   Pulse 94   Temp 97.8 F (36.6 C) (Oral)   Resp 17   Ht 5\' 3"  (1.6 m)   Wt 78.5 kg   SpO2 98%   BMI 30.65 kg/m  Physical Exam Constitutional:      General: She is not in acute distress.    Appearance: Normal appearance.  HENT:     Head: Normocephalic.     Nose: Nose normal.  Eyes:     Extraocular Movements: Extraocular movements intact.  Cardiovascular:     Rate and Rhythm: Normal rate.  Pulmonary:  Effort: Pulmonary effort is normal.  Musculoskeletal:        General: Normal range of motion.     Cervical back: Normal range of motion.  Neurological:     General: No focal deficit present.     Mental Status: She is alert. Mental status is at baseline.     ED Results / Procedures / Treatments   Labs (all labs ordered are listed, but only abnormal results are displayed) Labs Reviewed  CBC WITH DIFFERENTIAL/PLATELET - Abnormal; Notable for the following components:      Result Value   RBC 3.29 (*)    Hemoglobin 9.7 (*)    HCT 30.4 (*)    All other components within normal limits  COMPREHENSIVE METABOLIC PANEL - Abnormal; Notable for the following components:   Sodium 133 (*)     Glucose, Bld 321 (*)    BUN 53 (*)    Creatinine, Ser 4.22 (*)    Calcium 8.5 (*)    Total Protein 6.4 (*)    Albumin 2.3 (*)    GFR, Estimated 9 (*)    All other components within normal limits  CBG MONITORING, ED - Abnormal; Notable for the following components:   Glucose-Capillary 45 (*)    All other components within normal limits  CBG MONITORING, ED - Abnormal; Notable for the following components:   Glucose-Capillary 135 (*)    All other components within normal limits  URINALYSIS, ROUTINE W REFLEX MICROSCOPIC  CBG MONITORING, ED  CBG MONITORING, ED    EKG EKG Interpretation  Date/Time:  Sunday June 03 2022 19:03:57 EDT Ventricular Rate:  85 PR Interval:  262 QRS Duration: 105 QT Interval:  434 QTC Calculation: 517 R Axis:   -58 Text Interpretation: Sinus rhythm Prolonged PR interval Probable left atrial enlargement Left anterior fascicular block Abnormal R-wave progression, late transition Left ventricular hypertrophy Prolonged QT interval Confirmed by Norman Clay ([id number]) on 06/03/2022 7:12:47 PM  Radiology No results found.  Procedures Procedures    Medications Ordered in ED Medications  dextrose 50 % solution 50 mL (50 mLs Intravenous Given 06/03/22 1900)    ED Course/ Medical Decision Making/ A&P                           Medical Decision Making Amount and/or Complexity of Data Reviewed Labs: ordered.  Risk Prescription drug management.   Review of records show prior admission in August 2019 for hyperkalemia.  History obtained from family bedside.  Cardiac monitoring showing sinus rhythm.  Sent, white count 8.7 hemoglobin 9.7.  Creatinine of 4.2 which the family states is her baseline.  Potassium 3.5.  Blood sugar was low again in the 50s.  Patient became much more drowsy here.  She was given amp of D50, now tolerating oral intake.  Blood sugar being monitored in the ER.  Peers to have stabilized after Interventions.  Continues to have normal mental  status.  To be discharged home.  Family educated to avoid giving insulin if the patient is not eating anything and to check her blood sugars before administering insulin.  Family states understanding, discharged home in stable condition.  Advised outpatient follow-up with her doctors within 3 to 4 days.        Final Clinical Impression(s) / ED Diagnoses Final diagnoses:  Hypoglycemia    Rx / DC Orders ED Discharge Orders     None         China, Ivin Booty  S, MD 06/03/22 2206

## 2022-06-03 NOTE — Discharge Instructions (Addendum)
Do not give insulin if the patient is not eating appropriate mount of food.  Also be sure to check her blood sugars before giving additional insulin as it may cause her blood sugars to drop too much.  Follow-up with your primary care doctor this week.  Turn to the ER if your blood sugars still remain poorly controlled, or if you have any additional concerns confusion fevers pain or any additional concerns.

## 2022-06-03 NOTE — ED Triage Notes (Addendum)
PER EMS: pt from home with hx of dementia, alert to self only at baseline. Family called EMS because she was lethargic "not acting right" for "a few days now." EMS arrived and her CBG was 52, insulin dependent diabetic and has not been eating. Family has not been checking her blood sugars and continuing to give full doses of her insulin, despite her not eating. EMS administered D10 and CBG increased to 92. Pt usually ambulatory at home.  BP-138/88, HR-92, 96% RA
